# Patient Record
Sex: Male | Born: 1940 | Race: White | Hispanic: No | Marital: Married | Smoking: Never smoker
Health system: Southern US, Community
[De-identification: ages and names within clinical notes are randomized; demographics above are authoritative.]

## PROBLEM LIST (undated history)

## (undated) DIAGNOSIS — F32A Depression, unspecified: Secondary | ICD-10-CM

## (undated) DIAGNOSIS — C4A9 Merkel cell carcinoma, unspecified: Secondary | ICD-10-CM

## (undated) DIAGNOSIS — F0390 Unspecified dementia without behavioral disturbance: Secondary | ICD-10-CM

## (undated) DIAGNOSIS — I10 Essential (primary) hypertension: Secondary | ICD-10-CM

## (undated) DIAGNOSIS — E785 Hyperlipidemia, unspecified: Secondary | ICD-10-CM

## (undated) DIAGNOSIS — C439 Malignant melanoma of skin, unspecified: Secondary | ICD-10-CM

## (undated) DIAGNOSIS — N400 Enlarged prostate without lower urinary tract symptoms: Secondary | ICD-10-CM

## (undated) HISTORY — PX: BACK SURGERY: SHX140

---

## 2011-07-18 DIAGNOSIS — I2510 Atherosclerotic heart disease of native coronary artery without angina pectoris: Secondary | ICD-10-CM | POA: Diagnosis present

## 2011-07-18 DIAGNOSIS — F419 Anxiety disorder, unspecified: Secondary | ICD-10-CM | POA: Insufficient documentation

## 2019-12-03 DIAGNOSIS — I517 Cardiomegaly: Secondary | ICD-10-CM | POA: Insufficient documentation

## 2020-02-29 DIAGNOSIS — K5792 Diverticulitis of intestine, part unspecified, without perforation or abscess without bleeding: Secondary | ICD-10-CM | POA: Insufficient documentation

## 2020-07-31 DIAGNOSIS — R4182 Altered mental status, unspecified: Secondary | ICD-10-CM | POA: Insufficient documentation

## 2020-08-07 DIAGNOSIS — E538 Deficiency of other specified B group vitamins: Secondary | ICD-10-CM | POA: Insufficient documentation

## 2020-08-07 DIAGNOSIS — S22000A Wedge compression fracture of unspecified thoracic vertebra, initial encounter for closed fracture: Secondary | ICD-10-CM | POA: Insufficient documentation

## 2020-08-07 DIAGNOSIS — N184 Chronic kidney disease, stage 4 (severe): Secondary | ICD-10-CM | POA: Insufficient documentation

## 2020-08-07 DIAGNOSIS — S92244A Nondisplaced fracture of medial cuneiform of right foot, initial encounter for closed fracture: Secondary | ICD-10-CM | POA: Insufficient documentation

## 2020-08-22 DIAGNOSIS — K5900 Constipation, unspecified: Secondary | ICD-10-CM | POA: Insufficient documentation

## 2021-02-23 ENCOUNTER — Emergency Department: Payer: Medicare PPO

## 2021-02-23 ENCOUNTER — Encounter: Payer: Self-pay | Admitting: Emergency Medicine

## 2021-02-23 ENCOUNTER — Observation Stay
Admission: EM | Admit: 2021-02-23 | Discharge: 2021-02-25 | Disposition: A | Discharge status: Home / Self Care | Payer: Medicare PPO | Attending: Obstetrics and Gynecology | Admitting: Obstetrics and Gynecology

## 2021-02-23 ENCOUNTER — Other Ambulatory Visit: Payer: Self-pay

## 2021-02-23 ENCOUNTER — Observation Stay: Payer: Medicare PPO

## 2021-02-23 DIAGNOSIS — X58XXXA Exposure to other specified factors, initial encounter: Secondary | ICD-10-CM | POA: Diagnosis not present

## 2021-02-23 DIAGNOSIS — J40 Bronchitis, not specified as acute or chronic: Secondary | ICD-10-CM | POA: Diagnosis not present

## 2021-02-23 DIAGNOSIS — I1310 Hypertensive heart and chronic kidney disease without heart failure, with stage 1 through stage 4 chronic kidney disease, or unspecified chronic kidney disease: Secondary | ICD-10-CM | POA: Diagnosis not present

## 2021-02-23 DIAGNOSIS — C439 Malignant melanoma of skin, unspecified: Secondary | ICD-10-CM | POA: Diagnosis present

## 2021-02-23 DIAGNOSIS — S2220XA Unspecified fracture of sternum, initial encounter for closed fracture: Secondary | ICD-10-CM | POA: Diagnosis present

## 2021-02-23 DIAGNOSIS — R059 Cough, unspecified: Secondary | ICD-10-CM | POA: Diagnosis not present

## 2021-02-23 DIAGNOSIS — R0789 Other chest pain: Secondary | ICD-10-CM | POA: Diagnosis present

## 2021-02-23 DIAGNOSIS — S2222XA Fracture of body of sternum, initial encounter for closed fracture: Secondary | ICD-10-CM | POA: Insufficient documentation

## 2021-02-23 DIAGNOSIS — M6281 Muscle weakness (generalized): Secondary | ICD-10-CM | POA: Diagnosis not present

## 2021-02-23 DIAGNOSIS — R590 Localized enlarged lymph nodes: Secondary | ICD-10-CM | POA: Diagnosis present

## 2021-02-23 DIAGNOSIS — E785 Hyperlipidemia, unspecified: Secondary | ICD-10-CM | POA: Diagnosis not present

## 2021-02-23 DIAGNOSIS — I2510 Atherosclerotic heart disease of native coronary artery without angina pectoris: Secondary | ICD-10-CM | POA: Diagnosis not present

## 2021-02-23 DIAGNOSIS — C4A9 Merkel cell carcinoma, unspecified: Secondary | ICD-10-CM | POA: Diagnosis not present

## 2021-02-23 DIAGNOSIS — R591 Generalized enlarged lymph nodes: Secondary | ICD-10-CM | POA: Diagnosis not present

## 2021-02-23 DIAGNOSIS — C4362 Malignant melanoma of left upper limb, including shoulder: Secondary | ICD-10-CM | POA: Insufficient documentation

## 2021-02-23 DIAGNOSIS — C773 Secondary and unspecified malignant neoplasm of axilla and upper limb lymph nodes: Secondary | ICD-10-CM

## 2021-02-23 DIAGNOSIS — C7A8 Other malignant neuroendocrine tumors: Secondary | ICD-10-CM | POA: Diagnosis not present

## 2021-02-23 DIAGNOSIS — Z79899 Other long term (current) drug therapy: Secondary | ICD-10-CM | POA: Diagnosis not present

## 2021-02-23 DIAGNOSIS — M8448XA Pathological fracture, other site, initial encounter for fracture: Secondary | ICD-10-CM

## 2021-02-23 DIAGNOSIS — R079 Chest pain, unspecified: Secondary | ICD-10-CM | POA: Diagnosis present

## 2021-02-23 DIAGNOSIS — N1832 Chronic kidney disease, stage 3b: Secondary | ICD-10-CM

## 2021-02-23 DIAGNOSIS — F0393 Unspecified dementia, unspecified severity, with mood disturbance: Secondary | ICD-10-CM | POA: Diagnosis not present

## 2021-02-23 DIAGNOSIS — I700 Atherosclerosis of aorta: Secondary | ICD-10-CM | POA: Insufficient documentation

## 2021-02-23 DIAGNOSIS — I10 Essential (primary) hypertension: Secondary | ICD-10-CM | POA: Diagnosis present

## 2021-02-23 DIAGNOSIS — N179 Acute kidney failure, unspecified: Secondary | ICD-10-CM | POA: Diagnosis not present

## 2021-02-23 DIAGNOSIS — N400 Enlarged prostate without lower urinary tract symptoms: Secondary | ICD-10-CM | POA: Diagnosis present

## 2021-02-23 DIAGNOSIS — R2681 Unsteadiness on feet: Secondary | ICD-10-CM | POA: Insufficient documentation

## 2021-02-23 DIAGNOSIS — Z20822 Contact with and (suspected) exposure to covid-19: Secondary | ICD-10-CM | POA: Insufficient documentation

## 2021-02-23 DIAGNOSIS — R7989 Other specified abnormal findings of blood chemistry: Secondary | ICD-10-CM

## 2021-02-23 DIAGNOSIS — F32A Depression, unspecified: Secondary | ICD-10-CM | POA: Diagnosis not present

## 2021-02-23 HISTORY — DX: Depression, unspecified: F32.A

## 2021-02-23 HISTORY — DX: Malignant melanoma of skin, unspecified: C43.9

## 2021-02-23 HISTORY — DX: Essential (primary) hypertension: I10

## 2021-02-23 HISTORY — DX: Hyperlipidemia, unspecified: E78.5

## 2021-02-23 HISTORY — DX: Benign prostatic hyperplasia without lower urinary tract symptoms: N40.0

## 2021-02-23 LAB — URINALYSIS, COMPLETE (UACMP) WITH MICROSCOPIC
Bacteria, UA: NONE SEEN
Bilirubin Urine: NEGATIVE
Glucose, UA: NEGATIVE mg/dL
Ketones, ur: NEGATIVE mg/dL
Leukocytes,Ua: NEGATIVE
Nitrite: NEGATIVE
Protein, ur: >300 mg/dL — AB
Specific Gravity, Urine: 1.02 (ref 1.005–1.030)
pH: 5.5 (ref 5.0–8.0)

## 2021-02-23 LAB — COMPREHENSIVE METABOLIC PANEL
ALT: 9 U/L (ref 0–44)
AST: 18 U/L (ref 15–41)
Albumin: 3.4 g/dL — ABNORMAL LOW (ref 3.5–5.0)
Alkaline Phosphatase: 41 U/L (ref 38–126)
Anion gap: 6 (ref 5–15)
BUN: 21 mg/dL (ref 8–23)
CO2: 26 mmol/L (ref 22–32)
Calcium: 8.6 mg/dL — ABNORMAL LOW (ref 8.9–10.3)
Chloride: 106 mmol/L (ref 98–111)
Creatinine, Ser: 1.95 mg/dL — ABNORMAL HIGH (ref 0.61–1.24)
GFR, Estimated: 34 mL/min — ABNORMAL LOW (ref >60)
Glucose, Bld: 89 mg/dL (ref 70–99)
Potassium: 3.7 mmol/L (ref 3.5–5.1)
Sodium: 138 mmol/L (ref 135–145)
Total Bilirubin: 0.8 mg/dL (ref 0.3–1.2)
Total Protein: 6.1 g/dL — ABNORMAL LOW (ref 6.5–8.1)

## 2021-02-23 LAB — CBC WITH DIFFERENTIAL/PLATELET
Abs Immature Granulocytes: 0.05 10*3/uL (ref 0.00–0.07)
Basophils Absolute: 0.1 10*3/uL (ref 0.0–0.1)
Basophils Relative: 1 %
Eosinophils Absolute: 0.5 10*3/uL (ref 0.0–0.5)
Eosinophils Relative: 9 %
HCT: 38.9 % — ABNORMAL LOW (ref 39.0–52.0)
Hemoglobin: 12.1 g/dL — ABNORMAL LOW (ref 13.0–17.0)
Immature Granulocytes: 1 %
Lymphocytes Relative: 17 %
Lymphs Abs: 1 10*3/uL (ref 0.7–4.0)
MCH: 31.4 pg (ref 26.0–34.0)
MCHC: 31.1 g/dL (ref 30.0–36.0)
MCV: 101 fL — ABNORMAL HIGH (ref 80.0–100.0)
Monocytes Absolute: 0.7 10*3/uL (ref 0.1–1.0)
Monocytes Relative: 12 %
Neutro Abs: 3.4 10*3/uL (ref 1.7–7.7)
Neutrophils Relative %: 60 %
Platelets: 174 10*3/uL (ref 150–400)
RBC: 3.85 MIL/uL — ABNORMAL LOW (ref 4.22–5.81)
RDW: 13.1 % (ref 11.5–15.5)
WBC: 5.6 10*3/uL (ref 4.0–10.5)
nRBC: 0 % (ref 0.0–0.2)

## 2021-02-23 LAB — PROTIME-INR
INR: 1 (ref 0.8–1.2)
Prothrombin Time: 13.5 seconds (ref 11.4–15.2)

## 2021-02-23 LAB — TROPONIN I (HIGH SENSITIVITY)
Troponin I (High Sensitivity): 5 ng/L (ref <18)
Troponin I (High Sensitivity): 5 ng/L (ref <18)
Troponin I (High Sensitivity): 9 ng/L (ref <18)

## 2021-02-23 LAB — APTT: aPTT: 34 seconds (ref 24–36)

## 2021-02-23 LAB — LACTIC ACID, PLASMA: Lactic Acid, Venous: 1.5 mmol/L (ref 0.5–1.9)

## 2021-02-23 LAB — RESP PANEL BY RT-PCR (FLU A&B, COVID) ARPGX2
Influenza A by PCR: NEGATIVE
Influenza B by PCR: NEGATIVE
SARS Coronavirus 2 by RT PCR: NEGATIVE

## 2021-02-23 LAB — D-DIMER, QUANTITATIVE: D-Dimer, Quant: 1.4 ug/mL-FEU — ABNORMAL HIGH (ref 0.00–0.50)

## 2021-02-23 MED ORDER — LATANOPROST 0.005 % OP SOLN
1.0000 [drp] | Freq: Every day | OPHTHALMIC | Status: DC
  Administered 2021-02-23 – 2021-02-24 (×2): 1 [drp] via OPHTHALMIC
  Filled 2021-02-23: qty 2.5

## 2021-02-23 MED ORDER — DORZOLAMIDE HCL-TIMOLOL MAL 2-0.5 % OP SOLN: 1.0000 [drp] | Freq: Two times a day (BID) | OPHTHALMIC | Status: DC

## 2021-02-23 MED ORDER — SIMVASTATIN 20 MG PO TABS
20.0000 mg | ORAL_TABLET | Freq: Every evening | ORAL | Status: DC
  Administered 2021-02-23 – 2021-02-24 (×2): 20 mg via ORAL
  Filled 2021-02-23: qty 1
  Filled 2021-02-23: qty 2

## 2021-02-23 MED ORDER — TIMOLOL MALEATE 0.5 % OP SOLN
1.0000 [drp] | Freq: Two times a day (BID) | OPHTHALMIC | Status: DC
  Administered 2021-02-23 – 2021-02-25 (×4): 1 [drp] via OPHTHALMIC
  Filled 2021-02-23: qty 5

## 2021-02-23 MED ORDER — ALBUTEROL SULFATE (2.5 MG/3ML) 0.083% IN NEBU
2.5000 mg | INHALATION_SOLUTION | RESPIRATORY_TRACT | Status: DC | PRN
  Administered 2021-02-23 – 2021-02-24 (×4): 2.5 mg via RESPIRATORY_TRACT
  Filled 2021-02-23 (×4): qty 3

## 2021-02-23 MED ORDER — ESCITALOPRAM OXALATE 10 MG PO TABS
10.0000 mg | ORAL_TABLET | Freq: Every day | ORAL | Status: DC
  Administered 2021-02-24 – 2021-02-25 (×2): 10 mg via ORAL
  Filled 2021-02-23 (×2): qty 1

## 2021-02-23 MED ORDER — ONDANSETRON HCL 4 MG/2ML IJ SOLN
4.0000 mg | Freq: Once | INTRAMUSCULAR | Status: AC
  Administered 2021-02-23: 4 mg via INTRAVENOUS
  Filled 2021-02-23: qty 2

## 2021-02-23 MED ORDER — ACETAMINOPHEN 325 MG PO TABS
650.0000 mg | ORAL_TABLET | Freq: Four times a day (QID) | ORAL | Status: DC | PRN
  Administered 2021-02-24: 650 mg via ORAL
  Filled 2021-02-23: qty 2

## 2021-02-23 MED ORDER — ASPIRIN EC 81 MG PO TBEC
81.0000 mg | DELAYED_RELEASE_TABLET | Freq: Every day | ORAL | Status: DC
  Administered 2021-02-24 – 2021-02-25 (×2): 81 mg via ORAL
  Filled 2021-02-23 (×2): qty 1

## 2021-02-23 MED ORDER — OXYCODONE-ACETAMINOPHEN 5-325 MG PO TABS
1.0000 | ORAL_TABLET | ORAL | Status: DC | PRN
Start: 2021-02-23 — End: 2021-02-25
  Administered 2021-02-23 – 2021-02-25 (×9): 1 via ORAL
  Filled 2021-02-23 (×9): qty 1

## 2021-02-23 MED ORDER — ASPIRIN 81 MG PO CHEW
324.0000 mg | CHEWABLE_TABLET | Freq: Once | ORAL | Status: AC
  Administered 2021-02-23: 324 mg via ORAL
  Filled 2021-02-23: qty 4

## 2021-02-23 MED ORDER — CALCIUM CARBONATE ANTACID 500 MG PO CHEW
1.0000 | CHEWABLE_TABLET | Freq: Two times a day (BID) | ORAL | Status: DC
  Administered 2021-02-23 – 2021-02-25 (×5): 200 mg via ORAL
  Filled 2021-02-23 (×5): qty 1

## 2021-02-23 MED ORDER — DM-GUAIFENESIN ER 30-600 MG PO TB12
1.0000 | ORAL_TABLET | Freq: Two times a day (BID) | ORAL | Status: DC | PRN
  Administered 2021-02-24 – 2021-02-25 (×3): 1 via ORAL
  Filled 2021-02-23 (×3): qty 1

## 2021-02-23 MED ORDER — ALBUTEROL SULFATE HFA 108 (90 BASE) MCG/ACT IN AERS: 2.0000 | INHALATION_SPRAY | RESPIRATORY_TRACT | Status: DC | PRN

## 2021-02-23 MED ORDER — MORPHINE SULFATE (PF) 4 MG/ML IV SOLN
4.0000 mg | Freq: Once | INTRAVENOUS | Status: AC
  Administered 2021-02-23: 4 mg via INTRAVENOUS
  Filled 2021-02-23: qty 1

## 2021-02-23 MED ORDER — AMLODIPINE BESYLATE 10 MG PO TABS
10.0000 mg | ORAL_TABLET | Freq: Every day | ORAL | Status: DC
  Administered 2021-02-23 – 2021-02-25 (×3): 10 mg via ORAL
  Filled 2021-02-23: qty 2
  Filled 2021-02-23 (×2): qty 1

## 2021-02-23 MED ORDER — TRAZODONE HCL 100 MG PO TABS
100.0000 mg | ORAL_TABLET | Freq: Every day | ORAL | Status: DC
  Administered 2021-02-23 – 2021-02-24 (×2): 100 mg via ORAL
  Filled 2021-02-23 (×2): qty 1

## 2021-02-23 MED ORDER — HEPARIN SODIUM (PORCINE) 5000 UNIT/ML IJ SOLN
5000.0000 [IU] | Freq: Three times a day (TID) | INTRAMUSCULAR | Status: DC
  Administered 2021-02-23 – 2021-02-25 (×6): 5000 [IU] via SUBCUTANEOUS
  Filled 2021-02-23 (×6): qty 1

## 2021-02-23 MED ORDER — TAMSULOSIN HCL 0.4 MG PO CAPS
0.4000 mg | ORAL_CAPSULE | Freq: Every day | ORAL | Status: DC
  Administered 2021-02-23 – 2021-02-24 (×2): 0.4 mg via ORAL
  Filled 2021-02-23 (×2): qty 1

## 2021-02-23 MED ORDER — DORZOLAMIDE HCL 2 % OP SOLN
1.0000 [drp] | Freq: Two times a day (BID) | OPHTHALMIC | Status: DC
  Administered 2021-02-23 – 2021-02-25 (×4): 1 [drp] via OPHTHALMIC
  Filled 2021-02-23: qty 10

## 2021-02-23 MED ORDER — IOHEXOL 350 MG/ML SOLN
60.0000 mL | Freq: Once | INTRAVENOUS | Status: AC | PRN
  Administered 2021-02-23: 60 mL via INTRAVENOUS

## 2021-02-23 MED ORDER — ONDANSETRON HCL 4 MG/2ML IJ SOLN
4.0000 mg | Freq: Three times a day (TID) | INTRAMUSCULAR | Status: DC | PRN
  Administered 2021-02-23: 4 mg via INTRAVENOUS
  Filled 2021-02-23: qty 2

## 2021-02-23 MED ORDER — NITROGLYCERIN 0.4 MG SL SUBL: 0.4000 mg | SUBLINGUAL_TABLET | SUBLINGUAL | Status: DC | PRN

## 2021-02-23 MED ORDER — FLUTICASONE PROPIONATE 50 MCG/ACT NA SUSP
2.0000 | Freq: Every day | NASAL | Status: DC
  Administered 2021-02-25: 2 via NASAL
  Filled 2021-02-23 (×2): qty 16

## 2021-02-23 MED ORDER — SENNOSIDES-DOCUSATE SODIUM 8.6-50 MG PO TABS
2.0000 | ORAL_TABLET | Freq: Two times a day (BID) | ORAL | Status: DC
  Administered 2021-02-23 – 2021-02-25 (×4): 2 via ORAL
  Filled 2021-02-23 (×4): qty 2

## 2021-02-23 MED ORDER — HYDRALAZINE HCL 20 MG/ML IJ SOLN: 5.0000 mg | INTRAMUSCULAR | Status: DC | PRN

## 2021-02-23 MED ORDER — POLYETHYLENE GLYCOL 3350 17 G PO PACK: 17.0000 g | PACK | Freq: Every day | ORAL | Status: DC | PRN

## 2021-02-23 MED ORDER — LORATADINE 10 MG PO TABS: 10.0000 mg | ORAL_TABLET | Freq: Every day | ORAL | Status: DC | PRN

## 2021-02-23 MED ORDER — MELATONIN 5 MG PO TABS
5.0000 mg | ORAL_TABLET | Freq: Every day | ORAL | Status: DC
  Administered 2021-02-23 – 2021-02-24 (×2): 5 mg via ORAL
  Filled 2021-02-23 (×2): qty 1

## 2021-02-23 MED ORDER — GABAPENTIN 100 MG PO CAPS
200.0000 mg | ORAL_CAPSULE | Freq: Every day | ORAL | Status: DC
  Administered 2021-02-23 – 2021-02-24 (×2): 200 mg via ORAL
  Filled 2021-02-23 (×2): qty 2

## 2021-02-23 MED ORDER — MORPHINE SULFATE (PF) 2 MG/ML IV SOLN
0.5000 mg | INTRAVENOUS | Status: DC | PRN
  Administered 2021-02-24: 0.5 mg via INTRAVENOUS
  Filled 2021-02-23: qty 1

## 2021-02-23 MED ORDER — SODIUM CHLORIDE 0.9 % IV SOLN: INTRAVENOUS | Status: AC
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
## 2021-02-23 NOTE — ED Notes (Signed)
Pt resting in bed at this time. VSS. NAD. Pt given drink. Urinal provided and pt was able to use it without any assistance. Call light within reach will continue to monitor.

## 2021-02-23 NOTE — Assessment & Plan Note (Signed)
-  see A&P under melanoma -Pain control: As needed morphine, Percocet, Tylenol -Incentive spirometry

## 2021-02-23 NOTE — Assessment & Plan Note (Signed)
-   Continue home medications 

## 2021-02-23 NOTE — Assessment & Plan Note (Signed)
-   Flomax 

## 2021-02-23 NOTE — Assessment & Plan Note (Signed)
Patient has a history of myeloma in left forearm, which was removed last year.  Now patient has left axillary lymphadenopathy and lytic sternal bone disease, concerning for metastasized disease.  Consulted Dr. Tasia Catchings of oncology -Follow-up oncology's recommendations

## 2021-02-23 NOTE — Assessment & Plan Note (Addendum)
Patient has pleuritic chest pain, CT angiogram is negative for PE.  Patient has tenderness over sternum which is likely due to lyctic sternum fracture, concerning for metastasized to disease.  Troponin negative x3  -Placed on telemetry bed for position -prn NGT -Aspirin -Trend troponin -Check A1c, FLP -As needed Percocet and morphine for pain

## 2021-02-23 NOTE — ED Notes (Signed)
Transport request put in

## 2021-02-23 NOTE — Assessment & Plan Note (Signed)
-  see A&P under melanoma

## 2021-02-23 NOTE — ED Triage Notes (Signed)
Patient to ED via [facility] from [facility] for CP that started approx 1 hour PTA. Patient has hx of high blood pressure.

## 2021-02-23 NOTE — Assessment & Plan Note (Addendum)
Blood pressure 185/170.  Patient is not taking medications currently -Start amlodipine 10 mg daily -IV hydralazine as needed

## 2021-02-23 NOTE — ED Notes (Signed)
Patient to CT at this time

## 2021-02-23 NOTE — ED Notes (Signed)
Family in room waiting to talk to Dr. Cinda Quest at this time. NAD. Will continue to monitor for changes.
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
## 2021-02-23 NOTE — Assessment & Plan Note (Signed)
-   Zocor 

## 2021-02-23 NOTE — Assessment & Plan Note (Signed)
Creatinine 1.95, BUN 21, no baseline creatinine available.  Not sure if patient has CKD, but likely, will treat as AKI -IV fluid: 75 cc/h normal saline for 6 hours -Avoid using renal toxic medications.

## 2021-02-23 NOTE — ED Notes (Signed)
[place]   WITH  [facility]

## 2021-02-23 NOTE — ED Notes (Signed)
Pt resting in room at this time. Does not want his dinner at this time. VSS. NAD. Will continue to monitor for changes.

## 2021-02-23 NOTE — ED Notes (Signed)
Pt eating lunch at this time. Call light within reach. Will continue to monitor.

## 2021-02-24 ENCOUNTER — Observation Stay: Payer: Medicare PPO

## 2021-02-24 DIAGNOSIS — R0789 Other chest pain: Secondary | ICD-10-CM | POA: Diagnosis not present

## 2021-02-24 LAB — LIPID PANEL
Cholesterol: 139 mg/dL (ref 0–200)
HDL: 40 mg/dL — ABNORMAL LOW (ref >40)
LDL Cholesterol: 80 mg/dL (ref 0–99)
Total CHOL/HDL Ratio: 3.5 RATIO
Triglycerides: 97 mg/dL (ref <150)
VLDL: 19 mg/dL (ref 0–40)

## 2021-02-24 LAB — BASIC METABOLIC PANEL
Anion gap: 8 (ref 5–15)
BUN: 23 mg/dL (ref 8–23)
CO2: 26 mmol/L (ref 22–32)
Calcium: 8.6 mg/dL — ABNORMAL LOW (ref 8.9–10.3)
Chloride: 104 mmol/L (ref 98–111)
Creatinine, Ser: 1.89 mg/dL — ABNORMAL HIGH (ref 0.61–1.24)
GFR, Estimated: 35 mL/min — ABNORMAL LOW (ref >60)
Glucose, Bld: 102 mg/dL — ABNORMAL HIGH (ref 70–99)
Potassium: 3.5 mmol/L (ref 3.5–5.1)
Sodium: 138 mmol/L (ref 135–145)

## 2021-02-24 LAB — HEMOGLOBIN A1C
Hgb A1c MFr Bld: 5.4 % (ref 4.8–5.6)
Mean Plasma Glucose: 108 mg/dL

## 2021-02-24 LAB — VITAMIN B12: Vitamin B-12: 187 pg/mL (ref 180–914)

## 2021-02-24 LAB — FOLATE: Folate: 19.9 ng/mL (ref >5.9)

## 2021-02-24 MED ORDER — LIDOCAINE 5 % EX PTCH
1.0000 | MEDICATED_PATCH | CUTANEOUS | Status: DC
  Administered 2021-02-24: 1 via TRANSDERMAL
  Filled 2021-02-24 (×2): qty 1

## 2021-02-24 MED ORDER — METHOCARBAMOL 500 MG PO TABS
500.0000 mg | ORAL_TABLET | Freq: Three times a day (TID) | ORAL | Status: DC
  Administered 2021-02-24 – 2021-02-25 (×3): 500 mg via ORAL
  Filled 2021-02-24 (×3): qty 1
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
## 2021-02-24 NOTE — Progress Notes (Signed)
OT Cancellation Note  Patient Details Name: Jason Lin MRN: [id number] DOB: 1940-09-18   Cancelled Treatment:    Reason Eval/Treat Not Completed: Pain limiting ability to participate. Consult received, chart reviewed. Upon attempt, pt reports "not doing well" and having 10/10 chest pain. RN promptly notified. Will re-attempt OT evaluation next date and will coordinate with nursing for optimal pain control.   Ardeth Perfect., MPH, MS, OTR/L ascom [phone] 02/24/21, 3:45 PM

## 2021-02-24 NOTE — Procedures (Signed)
Interventional Radiology Procedure Note  Procedure: US guided left axillary lymph node biopsy  Indication: Left axillary adenopathy  Findings: Please refer to procedural dictation for full description.  Complications: None  EBL: < 10 mL  Miachel Roux, MD [phone]

## 2021-02-25 ENCOUNTER — Telehealth: Payer: Self-pay

## 2021-02-25 DIAGNOSIS — C4A9 Merkel cell carcinoma, unspecified: Secondary | ICD-10-CM

## 2021-02-25 DIAGNOSIS — N1832 Chronic kidney disease, stage 3b: Secondary | ICD-10-CM

## 2021-02-25 DIAGNOSIS — I2510 Atherosclerotic heart disease of native coronary artery without angina pectoris: Secondary | ICD-10-CM | POA: Diagnosis present

## 2021-02-25 DIAGNOSIS — R0789 Other chest pain: Secondary | ICD-10-CM | POA: Diagnosis not present

## 2021-02-25 LAB — KAPPA/LAMBDA LIGHT CHAINS
Kappa free light chain: 54 mg/L — ABNORMAL HIGH (ref 3.3–19.4)
Kappa, lambda light chain ratio: 2.23 — ABNORMAL HIGH (ref 0.26–1.65)
Lambda free light chains: 24.2 mg/L (ref 5.7–26.3)

## 2021-02-25 MED ORDER — PREDNISONE 20 MG PO TABS
40.0000 mg | ORAL_TABLET | Freq: Every day | ORAL | Status: DC
  Administered 2021-02-25: 40 mg via ORAL
  Filled 2021-02-25: qty 2

## 2021-02-25 MED ORDER — PREDNISONE 20 MG PO TABS: 40.0000 mg | ORAL_TABLET | Freq: Every day | ORAL | 0 refills | Status: DC

## 2021-02-25 NOTE — Telephone Encounter (Signed)
please schedule PET, then MD (hospital follow up) a few days after PET (at least 1 week from today). Please inform facility [facility]) and POA (brother) of appts.

## 2021-02-25 NOTE — TOC Transition Note (Signed)
Transition of Care [facility]) - CM/SW Discharge Note   Patient Details  Name: Jason Lin MRN: [id number] Date of Birth: 11-19-1940  [facility]) CM/SW Contact:  Alberteen Sam, LCSW Phone Number: 02/25/2021, 1:04 PM   Clinical Narrative:     Patient will DC UW:[id number] house Anticipated DC date: 02/25/21 Family notified: brother Jason Lin Transport [id number]  Per MD patient ready for DC to [facility]. RN, patient, patient's family, and facility notified of DC. Discharge Summary sent to facility at [phone]. RN given number for report [phone] ask for med tech or care manager with assisted living. DC packet on chart. Ambulance transport requested for patient.  CSW signing off.  Pricilla Riffle, LCSW    Final next level of care: Assisted Living Barriers to Discharge: No Barriers Identified   Patient Goals and CMS Choice Patient states their goals for this hospitalization and ongoing recovery are:: to go home CMS Medicare.gov Compare Post Acute Care list provided to:: Patient Represenative (must comment) (brother Jason Lin) Choice offered to / list presented to : Adult Children  Discharge Placement              Patient chooses bed at:  [facility]) Patient to be transferred to facility by: [facility] Name of family member notified: brother Jason Lin Patient and family notified of of transfer: 02/25/21  Discharge Plan and Services                                     Social Determinants of Health (SDOH) Interventions     Readmission Risk Interventions No flowsheet data found.

## 2021-02-25 NOTE — NC FL2 (Signed)
[place] LEVEL OF CARE SCREENING TOOL     IDENTIFICATION  Patient Name: Jason Lin Birthdate: Dec 14, 1940 Sex: male Admission Date (Current Location): 02/23/2021  [facility] and [place] Number:  Engineering geologist and Address:   [facility], [address], [place], [place] [address]      Provider Number: [id number]  Attending Physician Name and Address:  Gwynne Edinger, MD  Relative Name and Phone Number:  Laveda Abbe (brother) [phone]    Current Level of Care: Hospital Recommended Level of Care: Assisted Living Facility Prior Approval Number:    Date Approved/Denied:   PASRR Number:    Discharge Plan: Other (Comment) ([facility])    Current Diagnoses: Patient Active Problem List   Diagnosis Date Noted   CAD (coronary artery disease) 02/25/2021   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   Stage 3b chronic kidney disease (CKD) ([place]) 02/25/2021   Chest pain 02/23/2021   Closed fracture sternum 02/23/2021   Axillary lymphadenopathy 02/23/2021   HTN (hypertension) 02/23/2021   AKI (acute kidney injury) ([place]) 02/23/2021   Hypertension 02/23/2021   HLD (hyperlipidemia) 02/23/2021   Depression 02/23/2021   BPH (benign prostatic hyperplasia) 02/23/2021   Positive D-dimer    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[place])     Orientation RESPIRATION BLADDER Height & Weight     Self, Situation, Place  Normal Continent Weight: 157 lb 13.6 oz (71.6 kg) Height:  5\' 5"  (165.1 cm)  BEHAVIORAL SYMPTOMS/MOOD NEUROLOGICAL BOWEL NUTRITION STATUS      Continent Diet (heart healthy)  AMBULATORY STATUS COMMUNICATION OF NEEDS Skin   Limited Assist Verbally Normal                       Personal Care Assistance Level of Assistance  Bathing, Feeding, Dressing, Total care Bathing Assistance: Independent Feeding assistance: Independent Dressing Assistance: Independent Total Care Assistance: Limited assistance   Functional Limitations Info  Sight, Hearing,  Speech Sight Info: Impaired Hearing Info: Impaired Speech Info: Adequate    SPECIAL CARE FACTORS FREQUENCY                       Contractures Contractures Info: Not present    Additional Factors Info  Code Status, Allergies Code Status Info: full Allergies Info: no known allergies           Current Medications (02/25/2021):     Discharge Medications: albuterol 108 (90 Base) MCG/ACT inhaler Commonly known as: VENTOLIN HFA Inhale 2 puffs into the lungs every 6 (six) hours as needed for wheezing or shortness of breath.    calcium carbonate 500 MG chewable tablet Commonly known as: TUMS - dosed in mg elemental calcium Chew 1 tablet by mouth 2 (two) times daily.    dorzolamide-timolol 22.3-6.8 MG/ML ophthalmic solution Commonly known as: COSOPT Place 1 drop into both eyes every 12 (twelve) hours.    escitalopram 10 MG tablet Commonly known as: LEXAPRO Take 10 mg by mouth daily.    fexofenadine 60 MG tablet Commonly known as: ALLEGRA Take 60 mg by mouth daily as needed (itching).    fluticasone 50 MCG/ACT nasal spray Commonly known as: FLONASE Place 2 sprays into both nostrils daily.    gabapentin 100 MG capsule Commonly known as: NEURONTIN Take 200 mg by mouth at bedtime.    latanoprost 0.005 % ophthalmic solution Commonly known as: XALATAN 1 drop at bedtime.    melatonin 3 MG Tabs tablet Take 6 mg by mouth at bedtime.  nitroGLYCERIN 0.4 MG SL tablet Commonly known as: NITROSTAT Place 0.4 mg under the tongue every 5 (five) minutes as needed for chest pain.    oxyCODONE 5 MG immediate release tablet Commonly known as: Oxy IR/ROXICODONE Take 2.5 mg by mouth every 6 (six) hours as needed for severe pain.    polyethylene glycol 17 g packet Commonly known as: MIRALAX / GLYCOLAX Take 17 g by mouth daily.    predniSONE 20 MG tablet Commonly known as: DELTASONE Take 2 tablets (40 mg total) by mouth daily with breakfast.    senna-docusate 8.6-50 MG  tablet Commonly known as: Senokot-S Take 2 tablets by mouth 2 (two) times daily.    simvastatin 20 MG tablet Commonly known as: ZOCOR Take 20 mg by mouth daily.    tamsulosin 0.4 MG Caps capsule Commonly known as: FLOMAX Take 0.4 mg by mouth daily after supper.    traZODone 100 MG tablet Commonly known as: DESYREL Take 100 mg by mouth at bedtime.           Relevant Imaging Results:  Relevant Lab Results:   Additional Information [id number]  Alberteen Sam, LCSW
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
## 2021-02-25 NOTE — Telephone Encounter (Signed)
Spoke to [place] at [facility], she stated they provided transportation and they are aware of PET scan and follow up appts with Dr. Tasia Catchings.

## 2021-02-25 NOTE — Plan of Care (Signed)
°  Problem: Education: Goal: Knowledge of General Education information will improve Description: Including pain rating scale, medication(s)/side effects and non-pharmacologic comfort measures 02/25/2021 1328 by Emmaline Life, RN Outcome: Adequate for Discharge 02/25/2021 1328 by Emmaline Life, RN Outcome: Adequate for Discharge   Problem: Health Behavior/Discharge Planning: Goal: Ability to manage health-related needs will improve 02/25/2021 1328 by Emmaline Life, RN Outcome: Adequate for Discharge 02/25/2021 1328 by Emmaline Life, RN Outcome: Adequate for Discharge   Problem: Clinical Measurements: Goal: Ability to maintain clinical measurements within normal limits will improve 02/25/2021 1328 by Emmaline Life, RN Outcome: Adequate for Discharge 02/25/2021 1328 by Emmaline Life, RN Outcome: Adequate for Discharge Goal: Will remain free from infection 02/25/2021 1328 by Emmaline Life, RN Outcome: Adequate for Discharge 02/25/2021 1328 by Emmaline Life, RN Outcome: Adequate for Discharge Goal: Diagnostic test results will improve 02/25/2021 1328 by Emmaline Life, RN Outcome: Adequate for Discharge 02/25/2021 1328 by Emmaline Life, RN Outcome: Adequate for Discharge Goal: Respiratory complications will improve 02/25/2021 1328 by Emmaline Life, RN Outcome: Adequate for Discharge 02/25/2021 1328 by Emmaline Life, RN Outcome: Adequate for Discharge Goal: Cardiovascular complication will be avoided 02/25/2021 1328 by Emmaline Life, RN Outcome: Adequate for Discharge 02/25/2021 1328 by Emmaline Life, RN Outcome: Adequate for Discharge   Problem: Pain Managment: Goal: General experience of comfort will improve 02/25/2021 1328 by Emmaline Life, RN Outcome: Adequate for Discharge 02/25/2021 1328 by Emmaline Life, RN Outcome: Adequate for Discharge   Problem: Safety: Goal: Ability to remain free from injury will improve 02/25/2021 1328 by Emmaline Life, RN Outcome: Adequate for Discharge 02/25/2021 1328 by Emmaline Life, RN Outcome: Adequate for Discharge   Problem: Elimination: Goal: Will not experience complications related to bowel motility 02/25/2021 1328 by Emmaline Life, RN Outcome: Adequate for Discharge 02/25/2021 1328 by Emmaline Life, RN Outcome: Adequate for Discharge Goal: Will not experience complications related to urinary retention 02/25/2021 1328 by Emmaline Life, RN Outcome: Adequate for Discharge 02/25/2021 1328 by Emmaline Life, RN Outcome: Adequate for Discharge   Problem: Coping: Goal: Level of anxiety will decrease 02/25/2021 1328 by Emmaline Life, RN Outcome: Adequate for Discharge 02/25/2021 1328 by Emmaline Life, RN Outcome: Adequate for Discharge   Problem: Nutrition: Goal: Adequate nutrition will be maintained 02/25/2021 1328 by Emmaline Life, RN Outcome: Adequate for Discharge 02/25/2021 1328 by Emmaline Life, RN Outcome: Adequate for Discharge   Problem: Safety: Goal: Ability to remain free from injury will improve 02/25/2021 1328 by Emmaline Life, RN Outcome: Adequate for Discharge 02/25/2021 1328 by Emmaline Life, RN Outcome: Adequate for Discharge   Problem: Skin Integrity: Goal: Risk for impaired skin integrity will decrease 02/25/2021 1328 by Emmaline Life, RN Outcome: Adequate for Discharge 02/25/2021 1328 by Emmaline Life, RN Outcome: Adequate for Discharge   Problem: Acute Rehab OT Goals (only OT should resolve) Goal: Pt. Will Perform Grooming Outcome: Adequate for Discharge Goal: Pt. Will Perform Lower Body Dressing Outcome: Adequate for Discharge Goal: Pt. Will Transfer To Toilet Outcome: Adequate for Discharge Goal: Pt. Will Perform Toileting-Clothing Manipulation Outcome: Adequate for Discharge   Problem: Acute Rehab PT Goals(only PT should resolve) Goal: Pt Will Perform Standing Balance Or Pre-Gait Outcome: Adequate for  Discharge Goal: Pt Will Ambulate Outcome: Adequate for Discharge

## 2021-02-25 NOTE — Telephone Encounter (Signed)
Spoke to brother, Laveda Abbe, he verbalized understanding of appts. I left a VM with [place] regarding appts, will attempted to contact them again about transportation to visits and confirmation they are aware of the appts.

## 2021-02-25 NOTE — Telephone Encounter (Signed)
-----   Message from Earlie Server, MD sent at 02/24/2021  4:03 PM EST ----- He was seen by me during this admission. He will be dc soon. He lives in an assist living facility . He has dementia, brother is POA.  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  Follow up with me 2 days after PET, also at least 1 week from today - he got biopsy during admission. POA would like to come to meet him here when assist living facility transport him to his appt w me. Thanks.
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
## 2021-02-25 NOTE — Progress Notes (Signed)
Report called to [employer] spoke to [place]. Patient left floor via stretcher accompanied by EMS. Discharge paperwork given to ems. Family aware of plan of care.  Aryiah Monterosso, Tivis Ringer, RN
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
## 2021-02-26 ENCOUNTER — Other Ambulatory Visit: Payer: Self-pay | Admitting: Anatomic Pathology & Clinical Pathology

## 2021-02-26 LAB — MULTIPLE MYELOMA PANEL, SERUM
Albumin SerPl Elph-Mcnc: 3 g/dL (ref 2.9–4.4)
Albumin/Glob SerPl: 1.4 (ref 0.7–1.7)
Alpha 1: 0.2 g/dL (ref 0.0–0.4)
Alpha2 Glob SerPl Elph-Mcnc: 0.8 g/dL (ref 0.4–1.0)
B-Globulin SerPl Elph-Mcnc: 0.6 g/dL — ABNORMAL LOW (ref 0.7–1.3)
Gamma Glob SerPl Elph-Mcnc: 0.7 g/dL (ref 0.4–1.8)
Globulin, Total: 2.2 g/dL (ref 2.2–3.9)
IgA: 112 mg/dL (ref 61–437)
IgG (Immunoglobin G), Serum: 717 mg/dL (ref 603–1613)
IgM (Immunoglobulin M), Srm: 76 mg/dL (ref 15–143)
Total Protein ELP: 5.2 g/dL — ABNORMAL LOW (ref 6.0–8.5)

## 2021-02-26 LAB — SURGICAL PATHOLOGY

## 2021-02-27 ENCOUNTER — Encounter: Payer: Self-pay | Admitting: Oncology

## 2021-03-02 ENCOUNTER — Other Ambulatory Visit: Payer: Self-pay

## 2021-03-02 DIAGNOSIS — C4A9 Merkel cell carcinoma, unspecified: Secondary | ICD-10-CM

## 2021-03-03 ENCOUNTER — Ambulatory Visit
Admission: RE | Admit: 2021-03-03 | Discharge: 2021-03-03 | Disposition: A | Discharge status: Home / Self Care | Payer: Medicare PPO | Source: Ambulatory Visit | Attending: Oncology | Admitting: Oncology

## 2021-03-03 ENCOUNTER — Other Ambulatory Visit: Payer: Medicare PPO

## 2021-03-03 ENCOUNTER — Ambulatory Visit: Payer: Medicare PPO

## 2021-03-03 ENCOUNTER — Other Ambulatory Visit: Payer: Self-pay

## 2021-03-03 DIAGNOSIS — C4A9 Merkel cell carcinoma, unspecified: Secondary | ICD-10-CM | POA: Diagnosis present

## 2021-03-03 LAB — GLUCOSE, CAPILLARY: Glucose-Capillary: 91 mg/dL (ref 70–99)

## 2021-03-03 MED ORDER — FLUDEOXYGLUCOSE F - 18 (FDG) INJECTION
8.1000 | Freq: Once | INTRAVENOUS | Status: AC
  Administered 2021-03-03: 7.75 via INTRAVENOUS

## 2021-03-05 ENCOUNTER — Telehealth: Payer: Self-pay | Admitting: Oncology

## 2021-03-05 ENCOUNTER — Other Ambulatory Visit: Payer: Medicare PPO

## 2021-03-05 NOTE — Progress Notes (Signed)
Tumor Board Documentation  Paarth Cropper was presented by Dr Tasia Catchings at our Tumor Board on 03/05/2021, which included representatives from surgical, pharmacy, pulmonology, genetics, radiology, pathology, nutrition, research, navigation, radiation oncology, internal medicine, palliative care, medical oncology.  [facility] currently presents as a new patient, for [place], for new positive pathology with history of the following treatments: surgical intervention(s).  Additionally, we reviewed previous medical and familial history, history of present illness, and recent lab results along with all available histopathologic and imaging studies. The tumor board considered available treatment options and made the following recommendations: Radiation therapy (primary modality)    The following procedures/referrals were also placed: No orders of the defined types were placed in this encounter.   Clinical Trial Status: not discussed   Staging used: AJCC Stage Group AJCC Staging:       Group: Metastatic Merkel Cell Carcinoma   National site-specific guidelines NCCN were discussed with respect to the case.  Tumor board is a meeting of clinicians from various specialty areas who evaluate and discuss patients for whom a multidisciplinary approach is being considered. Final determinations in the plan of care are those of the provider(s). The responsibility for follow up of recommendations given during tumor board is that of the provider.   Todays extended care, comprehensive team conference, [place] was not present for the discussion and was not examined.   Multidisciplinary Tumor Board is a multidisciplinary case peer review process.  Decisions discussed in the Multidisciplinary Tumor Board reflect the opinions of the specialists present at the conference without having examined the patient.  Ultimately, treatment and diagnostic decisions rest with the primary provider(s) and the patient.

## 2021-03-05 NOTE — Telephone Encounter (Signed)
Spoke to pt brother, Laveda Abbe, and [employer]. Both are aware of appts scheduled for Dr. Donella Stade and dr. Tasia Catchings on 2/13.

## 2021-03-06 ENCOUNTER — Ambulatory Visit: Payer: Medicare PPO | Admitting: Oncology

## 2021-03-09 ENCOUNTER — Encounter: Payer: Self-pay | Admitting: Radiation Oncology

## 2021-03-09 ENCOUNTER — Inpatient Hospital Stay: Payer: Medicare PPO | Attending: Oncology | Admitting: Oncology

## 2021-03-09 ENCOUNTER — Encounter: Payer: Self-pay | Admitting: Oncology

## 2021-03-09 ENCOUNTER — Other Ambulatory Visit: Payer: Self-pay

## 2021-03-09 ENCOUNTER — Ambulatory Visit
Admission: RE | Admit: 2021-03-09 | Discharge: 2021-03-09 | Disposition: A | Discharge status: Home / Self Care | Payer: Medicare PPO | Source: Ambulatory Visit | Attending: Radiation Oncology | Admitting: Radiation Oncology

## 2021-03-09 VITALS — BP 140/78 | HR 64 | Temp 98.3°F | Resp 16 | Ht 65.0 in | Wt 156.0 lb

## 2021-03-09 VITALS — BP 140/78 | HR 64 | Temp 98.4°F | Wt 156.0 lb

## 2021-03-09 DIAGNOSIS — F0393 Unspecified dementia, unspecified severity, with mood disturbance: Secondary | ICD-10-CM | POA: Insufficient documentation

## 2021-03-09 DIAGNOSIS — C7A8 Other malignant neuroendocrine tumors: Secondary | ICD-10-CM | POA: Insufficient documentation

## 2021-03-09 DIAGNOSIS — Z8249 Family history of ischemic heart disease and other diseases of the circulatory system: Secondary | ICD-10-CM | POA: Insufficient documentation

## 2021-03-09 DIAGNOSIS — Z85820 Personal history of malignant melanoma of skin: Secondary | ICD-10-CM | POA: Diagnosis not present

## 2021-03-09 DIAGNOSIS — R931 Abnormal findings on diagnostic imaging of heart and coronary circulation: Secondary | ICD-10-CM | POA: Insufficient documentation

## 2021-03-09 DIAGNOSIS — C4A9 Merkel cell carcinoma, unspecified: Secondary | ICD-10-CM | POA: Diagnosis not present

## 2021-03-09 DIAGNOSIS — Z7189 Other specified counseling: Secondary | ICD-10-CM | POA: Diagnosis not present

## 2021-03-09 DIAGNOSIS — K5730 Diverticulosis of large intestine without perforation or abscess without bleeding: Secondary | ICD-10-CM | POA: Diagnosis not present

## 2021-03-09 DIAGNOSIS — I700 Atherosclerosis of aorta: Secondary | ICD-10-CM | POA: Insufficient documentation

## 2021-03-09 DIAGNOSIS — Z818 Family history of other mental and behavioral disorders: Secondary | ICD-10-CM | POA: Diagnosis not present

## 2021-03-09 DIAGNOSIS — Z79899 Other long term (current) drug therapy: Secondary | ICD-10-CM | POA: Insufficient documentation

## 2021-03-09 DIAGNOSIS — I2510 Atherosclerotic heart disease of native coronary artery without angina pectoris: Secondary | ICD-10-CM | POA: Insufficient documentation

## 2021-03-09 DIAGNOSIS — N400 Enlarged prostate without lower urinary tract symptoms: Secondary | ICD-10-CM | POA: Insufficient documentation

## 2021-03-09 DIAGNOSIS — I10 Essential (primary) hypertension: Secondary | ICD-10-CM | POA: Diagnosis not present

## 2021-03-09 DIAGNOSIS — E785 Hyperlipidemia, unspecified: Secondary | ICD-10-CM | POA: Insufficient documentation

## 2021-03-09 DIAGNOSIS — R590 Localized enlarged lymph nodes: Secondary | ICD-10-CM | POA: Insufficient documentation

## 2021-03-09 DIAGNOSIS — M47814 Spondylosis without myelopathy or radiculopathy, thoracic region: Secondary | ICD-10-CM | POA: Insufficient documentation

## 2021-03-09 DIAGNOSIS — Z9289 Personal history of other medical treatment: Secondary | ICD-10-CM | POA: Insufficient documentation

## 2021-03-09 DIAGNOSIS — K219 Gastro-esophageal reflux disease without esophagitis: Secondary | ICD-10-CM | POA: Insufficient documentation
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
## 2021-03-16 ENCOUNTER — Encounter: Payer: Self-pay | Admitting: *Deleted

## 2021-03-16 ENCOUNTER — Ambulatory Visit
Admission: RE | Admit: 2021-03-16 | Discharge: 2021-03-16 | Disposition: A | Discharge status: Home / Self Care | Payer: Medicare PPO | Source: Ambulatory Visit | Attending: Radiation Oncology | Admitting: Radiation Oncology

## 2021-03-16 DIAGNOSIS — Z510 Encounter for antineoplastic radiation therapy: Secondary | ICD-10-CM | POA: Diagnosis present

## 2021-03-16 DIAGNOSIS — C4A9 Merkel cell carcinoma, unspecified: Secondary | ICD-10-CM | POA: Insufficient documentation

## 2021-03-18 DIAGNOSIS — Z510 Encounter for antineoplastic radiation therapy: Secondary | ICD-10-CM | POA: Diagnosis not present

## 2021-03-19 ENCOUNTER — Other Ambulatory Visit: Payer: Self-pay

## 2021-03-19 ENCOUNTER — Ambulatory Visit (INDEPENDENT_AMBULATORY_CARE_PROVIDER_SITE_OTHER): Payer: Medicare PPO | Admitting: Podiatry

## 2021-03-19 ENCOUNTER — Other Ambulatory Visit: Payer: Self-pay | Admitting: *Deleted

## 2021-03-19 VITALS — BP 132/65 | HR 40 | Temp 98.6°F | Resp 20

## 2021-03-19 DIAGNOSIS — M79674 Pain in right toe(s): Secondary | ICD-10-CM

## 2021-03-19 DIAGNOSIS — M79675 Pain in left toe(s): Secondary | ICD-10-CM

## 2021-03-19 DIAGNOSIS — B351 Tinea unguium: Secondary | ICD-10-CM

## 2021-03-19 DIAGNOSIS — C4A9 Merkel cell carcinoma, unspecified: Secondary | ICD-10-CM

## 2021-03-19 NOTE — Progress Notes (Signed)
°  Subjective:  Patient ID: Jason Lin, male    DOB: 11-20-1940,  MRN: [id number]  Chief Complaint  Patient presents with   Nail Problem    "Trim his toenails and check his feet."   81 y.o. male returns for the above complaint.  Patient presents with thickened elongated dystrophic toenails x10.  Pain on palpation.  Patient states that hurts to ambulate.  He would like to have them debrided down he is not able to do it himself.  He denies any other acute complaints  Objective:   Vitals:   03/19/21 1021  BP: 132/65  Pulse: (!) 40  Resp: 20  Temp: 98.6 F (37 C)   Podiatric Exam: Vascular: dorsalis pedis and posterior tibial pulses are palpable bilateral. Capillary return is immediate. Temperature gradient is WNL. Skin turgor WNL  Sensorium: Normal Semmes Weinstein monofilament test. Normal tactile sensation bilaterally. Nail Exam: Pt has thick disfigured discolored nails with subungual debris noted bilateral entire nail hallux through fifth toenails.  Pain on palpation to the nails. Ulcer Exam: There is no evidence of ulcer or pre-ulcerative changes or infection. Orthopedic Exam: Muscle tone and strength are WNL. No limitations in general ROM. No crepitus or effusions noted.  Skin: No Porokeratosis. No infection or ulcers    Assessment & Plan:   1. Pain due to onychomycosis of toenails of both feet     Patient was evaluated and treated and all questions answered.  Onychomycosis with pain  -Nails palliatively debrided as below. -Educated on self-care  Procedure: Nail Debridement Rationale: pain  Type of Debridement: manual, sharp debridement. Instrumentation: Nail nipper, rotary burr. Number of Nails: 10  Procedures and Treatment: Consent by patient was obtained for treatment procedures. The patient understood the discussion of treatment and procedures well. All questions were answered thoroughly reviewed. Debridement of mycotic and hypertrophic toenails, 1 through 5  bilateral and clearing of subungual debris. No ulceration, no infection noted.  Return Visit-Office Procedure: Patient instructed to return to the office for a follow up visit 3 months for continued evaluation and treatment.  Boneta Lucks, DPM    No follow-ups on file.

## 2021-03-23 ENCOUNTER — Ambulatory Visit: Admission: RE | Admit: 2021-03-23 | Payer: Medicare PPO | Source: Ambulatory Visit

## 2021-03-23 DIAGNOSIS — Z510 Encounter for antineoplastic radiation therapy: Secondary | ICD-10-CM | POA: Diagnosis not present

## 2021-03-24 ENCOUNTER — Ambulatory Visit
Admission: RE | Admit: 2021-03-24 | Discharge: 2021-03-24 | Disposition: A | Discharge status: Home / Self Care | Payer: Medicare PPO | Source: Ambulatory Visit | Attending: Radiation Oncology | Admitting: Radiation Oncology

## 2021-03-24 DIAGNOSIS — Z510 Encounter for antineoplastic radiation therapy: Secondary | ICD-10-CM | POA: Diagnosis not present

## 2021-03-25 ENCOUNTER — Ambulatory Visit
Admission: RE | Admit: 2021-03-25 | Discharge: 2021-03-25 | Disposition: A | Discharge status: Home / Self Care | Payer: Medicare PPO | Source: Ambulatory Visit | Attending: Radiation Oncology | Admitting: Radiation Oncology

## 2021-03-25 DIAGNOSIS — C4A9 Merkel cell carcinoma, unspecified: Secondary | ICD-10-CM | POA: Diagnosis present

## 2021-03-25 DIAGNOSIS — Z510 Encounter for antineoplastic radiation therapy: Secondary | ICD-10-CM | POA: Insufficient documentation

## 2021-03-26 ENCOUNTER — Ambulatory Visit
Admission: RE | Admit: 2021-03-26 | Discharge: 2021-03-26 | Disposition: A | Discharge status: Home / Self Care | Payer: Medicare PPO | Source: Ambulatory Visit | Attending: Radiation Oncology | Admitting: Radiation Oncology

## 2021-03-26 DIAGNOSIS — Z510 Encounter for antineoplastic radiation therapy: Secondary | ICD-10-CM | POA: Diagnosis not present

## 2021-03-27 ENCOUNTER — Ambulatory Visit
Admission: RE | Admit: 2021-03-27 | Discharge: 2021-03-27 | Disposition: A | Discharge status: Home / Self Care | Payer: Medicare PPO | Source: Ambulatory Visit | Attending: Radiation Oncology | Admitting: Radiation Oncology

## 2021-03-27 DIAGNOSIS — Z510 Encounter for antineoplastic radiation therapy: Secondary | ICD-10-CM | POA: Diagnosis not present

## 2021-03-30 ENCOUNTER — Ambulatory Visit
Admission: RE | Admit: 2021-03-30 | Discharge: 2021-03-30 | Disposition: A | Discharge status: Home / Self Care | Payer: Medicare PPO | Source: Ambulatory Visit | Attending: Radiation Oncology | Admitting: Radiation Oncology

## 2021-03-30 DIAGNOSIS — Z510 Encounter for antineoplastic radiation therapy: Secondary | ICD-10-CM | POA: Diagnosis not present

## 2021-03-31 ENCOUNTER — Ambulatory Visit
Admission: RE | Admit: 2021-03-31 | Discharge: 2021-03-31 | Disposition: A | Discharge status: Home / Self Care | Payer: Medicare PPO | Source: Ambulatory Visit | Attending: Radiation Oncology | Admitting: Radiation Oncology

## 2021-03-31 DIAGNOSIS — Z510 Encounter for antineoplastic radiation therapy: Secondary | ICD-10-CM | POA: Diagnosis not present

## 2021-04-01 ENCOUNTER — Ambulatory Visit
Admission: RE | Admit: 2021-04-01 | Discharge: 2021-04-01 | Disposition: A | Discharge status: Home / Self Care | Payer: Medicare PPO | Source: Ambulatory Visit | Attending: Radiation Oncology | Admitting: Radiation Oncology

## 2021-04-01 DIAGNOSIS — Z510 Encounter for antineoplastic radiation therapy: Secondary | ICD-10-CM | POA: Diagnosis not present

## 2021-04-02 ENCOUNTER — Ambulatory Visit
Admission: RE | Admit: 2021-04-02 | Discharge: 2021-04-02 | Disposition: A | Discharge status: Home / Self Care | Payer: Medicare PPO | Source: Ambulatory Visit | Attending: Radiation Oncology | Admitting: Radiation Oncology

## 2021-04-02 DIAGNOSIS — Z510 Encounter for antineoplastic radiation therapy: Secondary | ICD-10-CM | POA: Diagnosis not present

## 2021-04-03 ENCOUNTER — Ambulatory Visit
Admission: RE | Admit: 2021-04-03 | Discharge: 2021-04-03 | Disposition: A | Discharge status: Home / Self Care | Payer: Medicare PPO | Source: Ambulatory Visit | Attending: Radiation Oncology | Admitting: Radiation Oncology

## 2021-04-03 DIAGNOSIS — Z510 Encounter for antineoplastic radiation therapy: Secondary | ICD-10-CM | POA: Diagnosis not present

## 2021-04-06 ENCOUNTER — Ambulatory Visit
Admission: RE | Admit: 2021-04-06 | Discharge: 2021-04-06 | Disposition: A | Discharge status: Home / Self Care | Payer: Medicare PPO | Source: Ambulatory Visit | Attending: Radiation Oncology | Admitting: Radiation Oncology

## 2021-04-06 DIAGNOSIS — Z510 Encounter for antineoplastic radiation therapy: Secondary | ICD-10-CM | POA: Diagnosis not present

## 2021-04-07 ENCOUNTER — Inpatient Hospital Stay: Payer: Medicare PPO | Attending: Oncology

## 2021-04-07 ENCOUNTER — Ambulatory Visit
Admission: RE | Admit: 2021-04-07 | Discharge: 2021-04-07 | Disposition: A | Discharge status: Home / Self Care | Payer: Medicare PPO | Source: Ambulatory Visit | Attending: Radiation Oncology | Admitting: Radiation Oncology

## 2021-04-07 DIAGNOSIS — Z510 Encounter for antineoplastic radiation therapy: Secondary | ICD-10-CM | POA: Diagnosis not present

## 2021-04-08 ENCOUNTER — Ambulatory Visit
Admission: RE | Admit: 2021-04-08 | Discharge: 2021-04-08 | Disposition: A | Discharge status: Home / Self Care | Payer: Medicare PPO | Source: Ambulatory Visit | Attending: Radiation Oncology | Admitting: Radiation Oncology

## 2021-04-08 DIAGNOSIS — Z510 Encounter for antineoplastic radiation therapy: Secondary | ICD-10-CM | POA: Diagnosis not present

## 2021-04-09 ENCOUNTER — Ambulatory Visit
Admission: RE | Admit: 2021-04-09 | Discharge: 2021-04-09 | Disposition: A | Discharge status: Home / Self Care | Payer: Medicare PPO | Source: Ambulatory Visit | Attending: Radiation Oncology | Admitting: Radiation Oncology

## 2021-04-09 DIAGNOSIS — Z510 Encounter for antineoplastic radiation therapy: Secondary | ICD-10-CM | POA: Diagnosis not present

## 2021-04-10 ENCOUNTER — Ambulatory Visit
Admission: RE | Admit: 2021-04-10 | Discharge: 2021-04-10 | Disposition: A | Discharge status: Home / Self Care | Payer: Medicare PPO | Source: Ambulatory Visit | Attending: Radiation Oncology | Admitting: Radiation Oncology

## 2021-04-10 DIAGNOSIS — Z510 Encounter for antineoplastic radiation therapy: Secondary | ICD-10-CM | POA: Diagnosis not present

## 2021-04-13 ENCOUNTER — Ambulatory Visit
Admission: RE | Admit: 2021-04-13 | Discharge: 2021-04-13 | Disposition: A | Discharge status: Home / Self Care | Payer: Medicare PPO | Source: Ambulatory Visit | Attending: Radiation Oncology | Admitting: Radiation Oncology

## 2021-04-13 DIAGNOSIS — Z510 Encounter for antineoplastic radiation therapy: Secondary | ICD-10-CM | POA: Diagnosis not present

## 2021-04-14 ENCOUNTER — Ambulatory Visit
Admission: RE | Admit: 2021-04-14 | Discharge: 2021-04-14 | Disposition: A | Discharge status: Home / Self Care | Payer: Medicare PPO | Source: Ambulatory Visit | Attending: Radiation Oncology | Admitting: Radiation Oncology

## 2021-04-14 DIAGNOSIS — Z510 Encounter for antineoplastic radiation therapy: Secondary | ICD-10-CM | POA: Diagnosis not present

## 2021-04-15 ENCOUNTER — Ambulatory Visit
Admission: RE | Admit: 2021-04-15 | Discharge: 2021-04-15 | Disposition: A | Discharge status: Home / Self Care | Payer: Medicare PPO | Source: Ambulatory Visit | Attending: Radiation Oncology | Admitting: Radiation Oncology

## 2021-04-15 DIAGNOSIS — Z510 Encounter for antineoplastic radiation therapy: Secondary | ICD-10-CM | POA: Diagnosis not present

## 2021-04-16 ENCOUNTER — Ambulatory Visit
Admission: RE | Admit: 2021-04-16 | Discharge: 2021-04-16 | Disposition: A | Discharge status: Home / Self Care | Payer: Medicare PPO | Source: Ambulatory Visit | Attending: Radiation Oncology | Admitting: Radiation Oncology

## 2021-04-16 DIAGNOSIS — Z510 Encounter for antineoplastic radiation therapy: Secondary | ICD-10-CM | POA: Diagnosis not present

## 2021-04-17 ENCOUNTER — Ambulatory Visit
Admission: RE | Admit: 2021-04-17 | Discharge: 2021-04-17 | Disposition: A | Discharge status: Home / Self Care | Payer: Medicare PPO | Source: Ambulatory Visit | Attending: Radiation Oncology | Admitting: Radiation Oncology

## 2021-04-17 DIAGNOSIS — Z510 Encounter for antineoplastic radiation therapy: Secondary | ICD-10-CM | POA: Diagnosis not present

## 2021-04-20 ENCOUNTER — Ambulatory Visit
Admission: RE | Admit: 2021-04-20 | Discharge: 2021-04-20 | Disposition: A | Discharge status: Home / Self Care | Payer: Medicare PPO | Source: Ambulatory Visit | Attending: Radiation Oncology | Admitting: Radiation Oncology

## 2021-04-20 DIAGNOSIS — Z510 Encounter for antineoplastic radiation therapy: Secondary | ICD-10-CM | POA: Diagnosis not present

## 2021-04-21 ENCOUNTER — Ambulatory Visit
Admission: RE | Admit: 2021-04-21 | Discharge: 2021-04-21 | Disposition: A | Discharge status: Home / Self Care | Payer: Medicare PPO | Source: Ambulatory Visit | Attending: Radiation Oncology | Admitting: Radiation Oncology

## 2021-04-21 ENCOUNTER — Inpatient Hospital Stay: Payer: Medicare PPO

## 2021-04-21 DIAGNOSIS — Z510 Encounter for antineoplastic radiation therapy: Secondary | ICD-10-CM | POA: Diagnosis not present

## 2021-04-22 ENCOUNTER — Ambulatory Visit
Admission: RE | Admit: 2021-04-22 | Discharge: 2021-04-22 | Disposition: A | Discharge status: Home / Self Care | Payer: Medicare PPO | Source: Ambulatory Visit | Attending: Radiation Oncology | Admitting: Radiation Oncology

## 2021-04-22 DIAGNOSIS — Z510 Encounter for antineoplastic radiation therapy: Secondary | ICD-10-CM | POA: Diagnosis not present

## 2021-04-23 ENCOUNTER — Ambulatory Visit
Admission: RE | Admit: 2021-04-23 | Discharge: 2021-04-23 | Disposition: A | Discharge status: Home / Self Care | Payer: Medicare PPO | Source: Ambulatory Visit | Attending: Radiation Oncology | Admitting: Radiation Oncology

## 2021-04-23 DIAGNOSIS — Z510 Encounter for antineoplastic radiation therapy: Secondary | ICD-10-CM | POA: Diagnosis not present

## 2021-04-24 ENCOUNTER — Ambulatory Visit
Admission: RE | Admit: 2021-04-24 | Discharge: 2021-04-24 | Disposition: A | Discharge status: Home / Self Care | Payer: Medicare PPO | Source: Ambulatory Visit | Attending: Radiation Oncology | Admitting: Radiation Oncology

## 2021-04-24 DIAGNOSIS — Z510 Encounter for antineoplastic radiation therapy: Secondary | ICD-10-CM | POA: Diagnosis not present

## 2021-04-27 ENCOUNTER — Ambulatory Visit
Admission: RE | Admit: 2021-04-27 | Discharge: 2021-04-27 | Disposition: A | Discharge status: Home / Self Care | Payer: Medicare PPO | Source: Ambulatory Visit | Attending: Radiation Oncology | Admitting: Radiation Oncology

## 2021-04-27 DIAGNOSIS — Z510 Encounter for antineoplastic radiation therapy: Secondary | ICD-10-CM | POA: Diagnosis present

## 2021-04-27 DIAGNOSIS — C4A9 Merkel cell carcinoma, unspecified: Secondary | ICD-10-CM | POA: Diagnosis present

## 2021-04-28 ENCOUNTER — Ambulatory Visit
Admission: RE | Admit: 2021-04-28 | Discharge: 2021-04-28 | Disposition: A | Discharge status: Home / Self Care | Payer: Medicare PPO | Source: Ambulatory Visit | Attending: Radiation Oncology | Admitting: Radiation Oncology

## 2021-04-28 DIAGNOSIS — Z510 Encounter for antineoplastic radiation therapy: Secondary | ICD-10-CM | POA: Diagnosis not present

## 2021-04-29 ENCOUNTER — Ambulatory Visit
Admission: RE | Admit: 2021-04-29 | Discharge: 2021-04-29 | Disposition: A | Discharge status: Home / Self Care | Payer: Medicare PPO | Source: Ambulatory Visit | Attending: Radiation Oncology | Admitting: Radiation Oncology

## 2021-04-29 DIAGNOSIS — Z510 Encounter for antineoplastic radiation therapy: Secondary | ICD-10-CM | POA: Diagnosis not present

## 2021-04-30 ENCOUNTER — Ambulatory Visit
Admission: RE | Admit: 2021-04-30 | Discharge: 2021-04-30 | Disposition: A | Discharge status: Home / Self Care | Payer: Medicare PPO | Source: Ambulatory Visit | Attending: Radiation Oncology | Admitting: Radiation Oncology

## 2021-04-30 DIAGNOSIS — Z510 Encounter for antineoplastic radiation therapy: Secondary | ICD-10-CM | POA: Diagnosis not present

## 2021-05-05 ENCOUNTER — Telehealth: Payer: Self-pay | Admitting: *Deleted

## 2021-05-05 NOTE — Telephone Encounter (Signed)
Message from answering service stating that [facility] called want clarification of what is going on with patient. Please return his call ?

## 2021-05-06 ENCOUNTER — Telehealth: Payer: Self-pay | Admitting: Oncology

## 2021-05-06 ENCOUNTER — Telehealth: Payer: Self-pay

## 2021-05-06 DIAGNOSIS — C4A9 Merkel cell carcinoma, unspecified: Secondary | ICD-10-CM

## 2021-05-06 NOTE — Telephone Encounter (Signed)
Retrurned call to POA/ Brother of pt with appt information for PET,LAb,MD ..KJ ?

## 2021-05-06 NOTE — Telephone Encounter (Signed)
Spoke with patients brother ,Laveda Abbe, who had some questions regarding patients follow up plan. He also wanted to clarify of patient needs to go back to [facility]. Advised per Dr. Tasia Catchings, patient does not need to go back to [facility]. Informed patient Dr Tasia Catchings recommends patient PET scan in July and Labs/MD 2 days after to follow up with results. Laveda Abbe verbalized understanding.  ? ? ? ?Please schedule patient for: ?PET restaging in July  ?Labs/MD 2 days after to go over results.  ?Please notify brother, cecil and [employer] for appt. Thanks  ?

## 2021-05-06 NOTE — Telephone Encounter (Signed)
-----   Message from Daiva Huge, RN sent at 05/05/2021  4:12 PM EDT ----- ?Regarding: POA has questions ?Mr. Whitlows brother and POA has some questions about the next steps with his cancer since he completed radiation.   He was asking about [facility] and if he needs surgery etc.  He is asking if Dr. Tasia Catchings could give him a call back to discuss.  The POA is Martavius Lusty [phone].    ? ?Thanks,  ? ?Ana  ? ?

## 2021-05-25 ENCOUNTER — Encounter: Payer: Self-pay | Admitting: Radiation Oncology

## 2021-05-25 ENCOUNTER — Ambulatory Visit
Admission: RE | Admit: 2021-05-25 | Discharge: 2021-05-25 | Disposition: A | Discharge status: Home / Self Care | Payer: Medicare PPO | Source: Ambulatory Visit | Attending: Radiation Oncology | Admitting: Radiation Oncology

## 2021-05-25 VITALS — BP 125/73 | HR 53 | Temp 98.5°F | Resp 18 | Wt 161.4 lb

## 2021-05-25 DIAGNOSIS — Z923 Personal history of irradiation: Secondary | ICD-10-CM | POA: Diagnosis not present

## 2021-05-25 DIAGNOSIS — C4A9 Merkel cell carcinoma, unspecified: Secondary | ICD-10-CM | POA: Diagnosis not present

## 2021-05-25 NOTE — Progress Notes (Signed)
Radiation Oncology ?Follow up Note ? ?Name: Jason Lin   ?Date:   05/25/2021 ?MRN:  [id number] ?DOB: 1940-12-31  ? ? ?This 81 y.o. 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 ?REFERRING PROVIDER: No ref. provider found ? ?HPI: Patient is an 81 year old male now at 1 month having completed radiation therapy his left axillary nodes in a patient with prior history of Merkel cell carcinomas left upper arm.  He had developed left bulky axillary adenopathy.  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  Seen today in follow-up he complains of upper chest pain.  He states this has been this way for quite a while.  He has been missing some physician appointments which were rescheduling for him including his eye doctor.. ? ?COMPLICATIONS OF TREATMENT: none ? ?FOLLOW UP COMPLIANCE: keeps appointments  ? ?PHYSICAL EXAM:  ?BP 125/73   Pulse (!) 53   Temp 98.5 ?F (36.9 ?C)   Resp 18   Wt 161 lb 6.4 oz (73.2 kg)   BMI 26.86 kg/m?  ?No adenopathy is detected in the left axilla.  Range of motion of his upper extremities does not elicit pain.  Well-developed well-nourished patient in NAD. HEENT reveals PERLA, EOMI, discs not visualized.  Oral cavity is clear. No oral mucosal lesions are identified. Neck is clear without evidence of cervical or supraclavicular adenopathy. Lungs are clear to A&P. Cardiac examination is essentially unremarkable with regular rate and rhythm without murmur rub or thrill. Abdomen is benign with no organomegaly or masses noted. Motor sensory and DTR levels are equal and symmetric in the upper and lower extremities. Cranial nerves II through XII are grossly intact. Proprioception is intact. No peripheral adenopathy or edema is identified. No motor or sensory levels are noted. Crude visual  fields are within normal range. ? ?RADIOLOGY RESULTS: PET scan has been ordered for July ? ?PLAN: At this time I will follow-up with him after his PET scan is performed.  I will also schedule him to be seen by Josh in symptom management for his pain issues.  Follow-up appointment in July was given.  Patient knows to call with any concerns. ? ?I would like to take this opportunity to thank you for allowing me to participate in the care of your patient.. ?  ? Noreene Filbert, MD ? ?

## 2021-05-27 ENCOUNTER — Inpatient Hospital Stay: Payer: Medicare PPO | Admitting: Hospice and Palliative Medicine

## 2021-06-18 ENCOUNTER — Encounter: Payer: Self-pay | Admitting: Podiatry

## 2021-06-18 ENCOUNTER — Ambulatory Visit (INDEPENDENT_AMBULATORY_CARE_PROVIDER_SITE_OTHER): Payer: Medicare PPO | Admitting: Podiatry

## 2021-06-18 DIAGNOSIS — N184 Chronic kidney disease, stage 4 (severe): Secondary | ICD-10-CM

## 2021-06-18 DIAGNOSIS — B351 Tinea unguium: Secondary | ICD-10-CM | POA: Diagnosis not present

## 2021-06-18 DIAGNOSIS — M79675 Pain in left toe(s): Secondary | ICD-10-CM | POA: Diagnosis not present

## 2021-06-18 DIAGNOSIS — M79674 Pain in right toe(s): Secondary | ICD-10-CM | POA: Diagnosis not present

## 2021-06-18 NOTE — Progress Notes (Signed)
This patient returns to my office for at risk foot care.  This patient requires this care by a professional since this patient will be at risk due to having  CKD.  This patient is unable to cut nails himself since the patient cannot reach his nails.These nails are painful walking and wearing shoes.  This patient presents for at risk foot care today.  General Appearance  Alert, conversant and in no acute stress.  Vascular  Dorsalis pedis and posterior tibial  pulses are palpable  bilaterally.  Capillary return is within normal limits  bilaterally. Temperature is within normal limits  bilaterally.  Neurologic  Senn-Weinstein monofilament wire test within normal limits  bilaterally. Muscle power within normal limits bilaterally.  Nails Thick disfigured discolored nails with subungual debris  from hallux to fifth toes bilaterally. No evidence of bacterial infection or drainage bilaterally.  Orthopedic  No limitations of motion  feet .  No crepitus or effusions noted.  No bony pathology or digital deformities noted.  Skin  normotropic skin with no porokeratosis noted bilaterally.  No signs of infections or ulcers noted.     Onychomycosis  Pain in right toes  Pain in left toes  Consent was obtained for treatment procedures.   Mechanical debridement of nails 1-5  bilaterally performed with a nail nipper.  Filed with dremel without incident.    Return office visit     4 months                 Told patient to return for periodic foot care and evaluation due to potential at risk complications.   Gardiner Barefoot DPM

## 2021-07-07 ENCOUNTER — Other Ambulatory Visit: Payer: Medicare PPO

## 2021-07-07 ENCOUNTER — Ambulatory Visit: Payer: Medicare PPO | Admitting: Oncology

## 2021-07-20 ENCOUNTER — Other Ambulatory Visit: Payer: Self-pay

## 2021-07-20 ENCOUNTER — Inpatient Hospital Stay
Admission: EM | Admit: 2021-07-20 | Discharge: 2021-07-23 | DRG: 683 | Disposition: A | Discharge status: Nursing Home (Includes ICF) | Payer: Medicare Other | Source: Skilled Nursing Facility | Attending: Internal Medicine | Admitting: Internal Medicine

## 2021-07-20 ENCOUNTER — Emergency Department: Payer: Medicare Other

## 2021-07-20 ENCOUNTER — Encounter: Payer: Self-pay | Admitting: Emergency Medicine

## 2021-07-20 DIAGNOSIS — C4A9 Merkel cell carcinoma, unspecified: Secondary | ICD-10-CM | POA: Diagnosis present

## 2021-07-20 DIAGNOSIS — M47816 Spondylosis without myelopathy or radiculopathy, lumbar region: Secondary | ICD-10-CM | POA: Diagnosis present

## 2021-07-20 DIAGNOSIS — Z79890 Hormone replacement therapy (postmenopausal): Secondary | ICD-10-CM | POA: Diagnosis not present

## 2021-07-20 DIAGNOSIS — F0390 Unspecified dementia without behavioral disturbance: Secondary | ICD-10-CM | POA: Diagnosis present

## 2021-07-20 DIAGNOSIS — I2510 Atherosclerotic heart disease of native coronary artery without angina pectoris: Secondary | ICD-10-CM | POA: Diagnosis present

## 2021-07-20 DIAGNOSIS — Z91041 Radiographic dye allergy status: Secondary | ICD-10-CM | POA: Diagnosis not present

## 2021-07-20 DIAGNOSIS — R809 Proteinuria, unspecified: Secondary | ICD-10-CM | POA: Diagnosis present

## 2021-07-20 DIAGNOSIS — R1084 Generalized abdominal pain: Secondary | ICD-10-CM

## 2021-07-20 DIAGNOSIS — N179 Acute kidney failure, unspecified: Principal | ICD-10-CM | POA: Diagnosis present

## 2021-07-20 DIAGNOSIS — K219 Gastro-esophageal reflux disease without esophagitis: Secondary | ICD-10-CM | POA: Diagnosis present

## 2021-07-20 DIAGNOSIS — Z85820 Personal history of malignant melanoma of skin: Secondary | ICD-10-CM

## 2021-07-20 DIAGNOSIS — F32A Depression, unspecified: Secondary | ICD-10-CM | POA: Diagnosis present

## 2021-07-20 DIAGNOSIS — Z79899 Other long term (current) drug therapy: Secondary | ICD-10-CM

## 2021-07-20 DIAGNOSIS — N433 Hydrocele, unspecified: Secondary | ICD-10-CM | POA: Diagnosis present

## 2021-07-20 DIAGNOSIS — F0393 Unspecified dementia, unspecified severity, with mood disturbance: Secondary | ICD-10-CM | POA: Diagnosis present

## 2021-07-20 DIAGNOSIS — Z7951 Long term (current) use of inhaled steroids: Secondary | ICD-10-CM

## 2021-07-20 DIAGNOSIS — N189 Chronic kidney disease, unspecified: Secondary | ICD-10-CM | POA: Diagnosis present

## 2021-07-20 DIAGNOSIS — N400 Enlarged prostate without lower urinary tract symptoms: Secondary | ICD-10-CM | POA: Diagnosis present

## 2021-07-20 DIAGNOSIS — C7B1 Secondary Merkel cell carcinoma: Secondary | ICD-10-CM | POA: Diagnosis present

## 2021-07-20 DIAGNOSIS — J449 Chronic obstructive pulmonary disease, unspecified: Secondary | ICD-10-CM | POA: Diagnosis not present

## 2021-07-20 DIAGNOSIS — M940 Chondrocostal junction syndrome [Tietze]: Secondary | ICD-10-CM | POA: Diagnosis not present

## 2021-07-20 DIAGNOSIS — I129 Hypertensive chronic kidney disease with stage 1 through stage 4 chronic kidney disease, or unspecified chronic kidney disease: Secondary | ICD-10-CM | POA: Diagnosis present

## 2021-07-20 DIAGNOSIS — I10 Essential (primary) hypertension: Secondary | ICD-10-CM | POA: Diagnosis present

## 2021-07-20 DIAGNOSIS — N50812 Left testicular pain: Principal | ICD-10-CM

## 2021-07-20 DIAGNOSIS — Z8249 Family history of ischemic heart disease and other diseases of the circulatory system: Secondary | ICD-10-CM

## 2021-07-20 DIAGNOSIS — E785 Hyperlipidemia, unspecified: Secondary | ICD-10-CM | POA: Diagnosis not present

## 2021-07-20 DIAGNOSIS — Z888 Allergy status to other drugs, medicaments and biological substances status: Secondary | ICD-10-CM | POA: Diagnosis not present

## 2021-07-20 DIAGNOSIS — Z923 Personal history of irradiation: Secondary | ICD-10-CM | POA: Diagnosis not present

## 2021-07-20 DIAGNOSIS — N184 Chronic kidney disease, stage 4 (severe): Secondary | ICD-10-CM | POA: Diagnosis present

## 2021-07-20 DIAGNOSIS — N5082 Scrotal pain: Secondary | ICD-10-CM

## 2021-07-20 HISTORY — DX: Merkel cell carcinoma, unspecified: C4A.9

## 2021-07-20 HISTORY — DX: Unspecified dementia, unspecified severity, without behavioral disturbance, psychotic disturbance, mood disturbance, and anxiety: F03.90

## 2021-07-20 LAB — TROPONIN I (HIGH SENSITIVITY)
Troponin I (High Sensitivity): 5 ng/L (ref <18)
Troponin I (High Sensitivity): 6 ng/L (ref <18)

## 2021-07-20 LAB — URINALYSIS, ROUTINE W REFLEX MICROSCOPIC
Bilirubin Urine: NEGATIVE
Glucose, UA: NEGATIVE mg/dL
Hgb urine dipstick: NEGATIVE
Ketones, ur: NEGATIVE mg/dL
Leukocytes,Ua: NEGATIVE
Nitrite: NEGATIVE
Protein, ur: 100 mg/dL — AB
Specific Gravity, Urine: 1.019 (ref 1.005–1.030)
Squamous Epithelial / HPF: NONE SEEN (ref 0–5)
pH: 5 (ref 5.0–8.0)

## 2021-07-20 LAB — COMPREHENSIVE METABOLIC PANEL
ALT: 15 U/L (ref 0–44)
AST: 13 U/L — ABNORMAL LOW (ref 15–41)
Albumin: 3.9 g/dL (ref 3.5–5.0)
Alkaline Phosphatase: 33 U/L — ABNORMAL LOW (ref 38–126)
Anion gap: 6 (ref 5–15)
BUN: 34 mg/dL — ABNORMAL HIGH (ref 8–23)
CO2: 23 mmol/L (ref 22–32)
Calcium: 9.3 mg/dL (ref 8.9–10.3)
Chloride: 110 mmol/L (ref 98–111)
Creatinine, Ser: 2.81 mg/dL — ABNORMAL HIGH (ref 0.61–1.24)
GFR, Estimated: 22 mL/min — ABNORMAL LOW (ref >60)
Glucose, Bld: 83 mg/dL (ref 70–99)
Potassium: 4.5 mmol/L (ref 3.5–5.1)
Sodium: 139 mmol/L (ref 135–145)
Total Bilirubin: 1.1 mg/dL (ref 0.3–1.2)
Total Protein: 6.5 g/dL (ref 6.5–8.1)

## 2021-07-20 LAB — CBC
HCT: 36 % — ABNORMAL LOW (ref 39.0–52.0)
Hemoglobin: 11.4 g/dL — ABNORMAL LOW (ref 13.0–17.0)
MCH: 31.2 pg (ref 26.0–34.0)
MCHC: 31.7 g/dL (ref 30.0–36.0)
MCV: 98.6 fL (ref 80.0–100.0)
Platelets: 147 10*3/uL — ABNORMAL LOW (ref 150–400)
RBC: 3.65 MIL/uL — ABNORMAL LOW (ref 4.22–5.81)
RDW: 12.7 % (ref 11.5–15.5)
WBC: 4.1 10*3/uL (ref 4.0–10.5)
nRBC: 0 % (ref 0.0–0.2)

## 2021-07-20 LAB — LIPASE, BLOOD: Lipase: 38 U/L (ref 11–51)

## 2021-07-20 MED ORDER — MOMETASONE FURO-FORMOTEROL FUM 100-5 MCG/ACT IN AERO
2.0000 | INHALATION_SPRAY | Freq: Two times a day (BID) | RESPIRATORY_TRACT | Status: DC
  Administered 2021-07-21 – 2021-07-23 (×5): 2 via RESPIRATORY_TRACT
  Filled 2021-07-20: qty 8.8

## 2021-07-20 MED ORDER — MELATONIN 5 MG PO TABS
5.0000 mg | ORAL_TABLET | Freq: Every day | ORAL | Status: DC
  Administered 2021-07-20 – 2021-07-22 (×3): 5 mg via ORAL
  Filled 2021-07-20 (×3): qty 1

## 2021-07-20 MED ORDER — OXYCODONE-ACETAMINOPHEN 5-325 MG PO TABS
1.0000 | ORAL_TABLET | ORAL | Status: DC | PRN
  Administered 2021-07-20 – 2021-07-23 (×6): 1 via ORAL
  Filled 2021-07-20 (×6): qty 1

## 2021-07-20 MED ORDER — ACETAMINOPHEN 500 MG PO TABS
1000.0000 mg | ORAL_TABLET | Freq: Three times a day (TID) | ORAL | Status: DC | PRN
  Administered 2021-07-20: 1000 mg via ORAL
  Filled 2021-07-20: qty 2

## 2021-07-20 MED ORDER — MORPHINE SULFATE (PF) 4 MG/ML IV SOLN
4.0000 mg | Freq: Once | INTRAVENOUS | Status: AC
  Administered 2021-07-20: 4 mg via INTRAVENOUS
  Filled 2021-07-20: qty 1

## 2021-07-20 MED ORDER — LIDOCAINE 5 % EX PTCH
1.0000 | MEDICATED_PATCH | CUTANEOUS | Status: DC
  Administered 2021-07-20 – 2021-07-22 (×3): 1 via TRANSDERMAL
  Filled 2021-07-20 (×4): qty 1

## 2021-07-20 MED ORDER — TRAZODONE HCL 50 MG PO TABS
100.0000 mg | ORAL_TABLET | Freq: Every day | ORAL | Status: DC
  Administered 2021-07-20 – 2021-07-22 (×3): 100 mg via ORAL
  Filled 2021-07-20 (×3): qty 2

## 2021-07-20 MED ORDER — TAMSULOSIN HCL 0.4 MG PO CAPS
0.4000 mg | ORAL_CAPSULE | Freq: Every day | ORAL | Status: DC
  Administered 2021-07-20 – 2021-07-22 (×3): 0.4 mg via ORAL
  Filled 2021-07-20 (×3): qty 1

## 2021-07-20 MED ORDER — OXYCODONE HCL 5 MG PO TABS
2.5000 mg | ORAL_TABLET | Freq: Four times a day (QID) | ORAL | Status: DC | PRN
  Administered 2021-07-20 – 2021-07-23 (×6): 2.5 mg via ORAL
  Filled 2021-07-20 (×6): qty 1

## 2021-07-20 MED ORDER — ALBUTEROL SULFATE (2.5 MG/3ML) 0.083% IN NEBU
3.0000 mL | INHALATION_SOLUTION | Freq: Four times a day (QID) | RESPIRATORY_TRACT | Status: DC | PRN
Start: 2021-07-20 — End: 2021-07-23

## 2021-07-20 MED ORDER — CALCIUM CARBONATE ANTACID 500 MG PO CHEW
1.0000 | CHEWABLE_TABLET | Freq: Two times a day (BID) | ORAL | Status: DC
Start: 2021-07-20 — End: 2021-07-23
  Administered 2021-07-20 – 2021-07-23 (×6): 200 mg via ORAL
  Filled 2021-07-20 (×6): qty 1

## 2021-07-20 MED ORDER — SIMVASTATIN 20 MG PO TABS
20.0000 mg | ORAL_TABLET | Freq: Every day | ORAL | Status: DC
  Administered 2021-07-20 – 2021-07-22 (×3): 20 mg via ORAL
  Filled 2021-07-20 (×3): qty 1

## 2021-07-20 MED ORDER — LORATADINE 10 MG PO TABS
10.0000 mg | ORAL_TABLET | Freq: Every day | ORAL | Status: DC
  Administered 2021-07-20 – 2021-07-23 (×4): 10 mg via ORAL
  Filled 2021-07-20 (×4): qty 1

## 2021-07-20 MED ORDER — MEDROXYPROGESTERONE ACETATE 2.5 MG PO TABS
5.0000 mg | ORAL_TABLET | ORAL | Status: DC
  Administered 2021-07-20 – 2021-07-23 (×6): 5 mg via ORAL
  Filled 2021-07-20 (×6): qty 2

## 2021-07-20 MED ORDER — ONDANSETRON HCL 4 MG/2ML IJ SOLN
4.0000 mg | Freq: Once | INTRAMUSCULAR | Status: AC
  Administered 2021-07-20: 4 mg via INTRAVENOUS
  Filled 2021-07-20: qty 2

## 2021-07-20 MED ORDER — SODIUM CHLORIDE 0.45 % IV SOLN: INTRAVENOUS | Status: AC

## 2021-07-20 MED ORDER — NITROGLYCERIN 0.4 MG SL SUBL: 0.4000 mg | SUBLINGUAL_TABLET | SUBLINGUAL | Status: DC | PRN

## 2021-07-20 MED ORDER — GABAPENTIN 100 MG PO CAPS
200.0000 mg | ORAL_CAPSULE | Freq: Every day | ORAL | Status: DC
  Administered 2021-07-20 – 2021-07-22 (×3): 200 mg via ORAL
  Filled 2021-07-20 (×3): qty 2

## 2021-07-20 MED ORDER — PSYLLIUM 0.52 G PO CAPS
0.5200 g | ORAL_CAPSULE | Freq: Every day | ORAL | Status: DC
Start: 2021-07-20 — End: 2021-07-20

## 2021-07-20 MED ORDER — POLYETHYLENE GLYCOL 3350 17 G PO PACK
17.0000 g | PACK | Freq: Every day | ORAL | Status: DC
  Administered 2021-07-20 – 2021-07-23 (×4): 17 g via ORAL
  Filled 2021-07-20 (×4): qty 1

## 2021-07-20 MED ORDER — LOPERAMIDE HCL 2 MG PO CAPS: 2.0000 mg | ORAL_CAPSULE | Freq: Two times a day (BID) | ORAL | Status: DC | PRN

## 2021-07-20 MED ORDER — PSYLLIUM 95 % PO PACK
1.0000 | PACK | Freq: Every day | ORAL | Status: DC
  Administered 2021-07-21 – 2021-07-23 (×3): 1 via ORAL
  Filled 2021-07-20 (×3): qty 1

## 2021-07-20 MED ORDER — SENNOSIDES-DOCUSATE SODIUM 8.6-50 MG PO TABS
2.0000 | ORAL_TABLET | Freq: Two times a day (BID) | ORAL | Status: DC
  Administered 2021-07-20 – 2021-07-23 (×4): 2 via ORAL
  Filled 2021-07-20 (×5): qty 2

## 2021-07-20 MED ORDER — DORZOLAMIDE HCL-TIMOLOL MAL 2-0.5 % OP SOLN
1.0000 [drp] | Freq: Two times a day (BID) | OPHTHALMIC | Status: DC
  Administered 2021-07-20 – 2021-07-23 (×6): 1 [drp] via OPHTHALMIC
  Filled 2021-07-20: qty 10

## 2021-07-20 MED ORDER — FLUTICASONE PROPIONATE 50 MCG/ACT NA SUSP
2.0000 | Freq: Every day | NASAL | Status: DC
  Administered 2021-07-21 – 2021-07-23 (×3): 2 via NASAL
  Filled 2021-07-20: qty 16

## 2021-07-20 MED ORDER — PANTOPRAZOLE SODIUM 40 MG PO TBEC
40.0000 mg | DELAYED_RELEASE_TABLET | Freq: Every day | ORAL | Status: DC
Start: 2021-07-20 — End: 2021-07-23
  Administered 2021-07-20 – 2021-07-23 (×4): 40 mg via ORAL
  Filled 2021-07-20 (×4): qty 1

## 2021-07-20 MED ORDER — FLUOXETINE HCL 20 MG PO CAPS
20.0000 mg | ORAL_CAPSULE | Freq: Every day | ORAL | Status: DC
  Administered 2021-07-20 – 2021-07-23 (×4): 20 mg via ORAL
  Filled 2021-07-20 (×4): qty 1

## 2021-07-20 MED ORDER — LATANOPROST 0.005 % OP SOLN
1.0000 [drp] | Freq: Every day | OPHTHALMIC | Status: DC
  Administered 2021-07-20 – 2021-07-22 (×3): 1 [drp] via OPHTHALMIC
  Filled 2021-07-20: qty 2.5

## 2021-07-20 MED ORDER — LACTATED RINGERS IV BOLUS
1000.0000 mL | Freq: Once | INTRAVENOUS | Status: AC
  Administered 2021-07-20: 1000 mL via INTRAVENOUS

## 2021-07-20 MED ORDER — ENOXAPARIN SODIUM 30 MG/0.3ML IJ SOSY
30.0000 mg | PREFILLED_SYRINGE | INTRAMUSCULAR | Status: DC
  Administered 2021-07-20 – 2021-07-22 (×3): 30 mg via SUBCUTANEOUS
  Filled 2021-07-20 (×3): qty 0.3
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
## 2021-07-20 NOTE — Assessment & Plan Note (Signed)
Continue statin therapy.

## 2021-07-20 NOTE — ED Notes (Signed)
Receiving RN Izora Gala has agreed to accept TOC once pt has arrived to inpatient unit, all questions and concerns address.

## 2021-07-20 NOTE — ED Notes (Signed)
Pt resting and watching TV, NAD noted, even RR and unlabored, call bell within reach for assistance, side rails up x2 for safety, pt voiced no concerns or questions at this time, care on going, will continue to monitor.  

## 2021-07-21 DIAGNOSIS — Z79899 Other long term (current) drug therapy: Secondary | ICD-10-CM | POA: Diagnosis not present

## 2021-07-21 DIAGNOSIS — K219 Gastro-esophageal reflux disease without esophagitis: Secondary | ICD-10-CM | POA: Diagnosis not present

## 2021-07-21 DIAGNOSIS — I10 Essential (primary) hypertension: Secondary | ICD-10-CM

## 2021-07-21 DIAGNOSIS — M47816 Spondylosis without myelopathy or radiculopathy, lumbar region: Secondary | ICD-10-CM | POA: Diagnosis not present

## 2021-07-21 DIAGNOSIS — N178 Other acute kidney failure: Secondary | ICD-10-CM | POA: Diagnosis not present

## 2021-07-21 DIAGNOSIS — N400 Enlarged prostate without lower urinary tract symptoms: Secondary | ICD-10-CM | POA: Diagnosis not present

## 2021-07-21 DIAGNOSIS — F32A Depression, unspecified: Secondary | ICD-10-CM | POA: Diagnosis not present

## 2021-07-21 DIAGNOSIS — I129 Hypertensive chronic kidney disease with stage 1 through stage 4 chronic kidney disease, or unspecified chronic kidney disease: Secondary | ICD-10-CM | POA: Diagnosis not present

## 2021-07-21 DIAGNOSIS — Z7951 Long term (current) use of inhaled steroids: Secondary | ICD-10-CM | POA: Diagnosis not present

## 2021-07-21 DIAGNOSIS — M940 Chondrocostal junction syndrome [Tietze]: Secondary | ICD-10-CM | POA: Diagnosis not present

## 2021-07-21 DIAGNOSIS — Z923 Personal history of irradiation: Secondary | ICD-10-CM | POA: Diagnosis not present

## 2021-07-21 DIAGNOSIS — C4A9 Merkel cell carcinoma, unspecified: Secondary | ICD-10-CM

## 2021-07-21 DIAGNOSIS — Z85820 Personal history of malignant melanoma of skin: Secondary | ICD-10-CM | POA: Diagnosis not present

## 2021-07-21 DIAGNOSIS — N189 Chronic kidney disease, unspecified: Secondary | ICD-10-CM | POA: Diagnosis present

## 2021-07-21 DIAGNOSIS — N184 Chronic kidney disease, stage 4 (severe): Secondary | ICD-10-CM

## 2021-07-21 DIAGNOSIS — R809 Proteinuria, unspecified: Secondary | ICD-10-CM | POA: Diagnosis not present

## 2021-07-21 DIAGNOSIS — F0393 Unspecified dementia, unspecified severity, with mood disturbance: Secondary | ICD-10-CM | POA: Diagnosis not present

## 2021-07-21 DIAGNOSIS — N179 Acute kidney failure, unspecified: Secondary | ICD-10-CM | POA: Diagnosis present

## 2021-07-21 DIAGNOSIS — Z91041 Radiographic dye allergy status: Secondary | ICD-10-CM | POA: Diagnosis not present

## 2021-07-21 DIAGNOSIS — Z79890 Hormone replacement therapy (postmenopausal): Secondary | ICD-10-CM | POA: Diagnosis not present

## 2021-07-21 DIAGNOSIS — N433 Hydrocele, unspecified: Secondary | ICD-10-CM | POA: Diagnosis not present

## 2021-07-21 DIAGNOSIS — E785 Hyperlipidemia, unspecified: Secondary | ICD-10-CM | POA: Diagnosis not present

## 2021-07-21 DIAGNOSIS — Z8249 Family history of ischemic heart disease and other diseases of the circulatory system: Secondary | ICD-10-CM | POA: Diagnosis not present

## 2021-07-21 DIAGNOSIS — J449 Chronic obstructive pulmonary disease, unspecified: Secondary | ICD-10-CM | POA: Diagnosis not present

## 2021-07-21 DIAGNOSIS — C7B1 Secondary Merkel cell carcinoma: Secondary | ICD-10-CM | POA: Diagnosis not present

## 2021-07-21 DIAGNOSIS — N50812 Left testicular pain: Secondary | ICD-10-CM | POA: Diagnosis present

## 2021-07-21 DIAGNOSIS — Z888 Allergy status to other drugs, medicaments and biological substances status: Secondary | ICD-10-CM | POA: Diagnosis not present

## 2021-07-21 DIAGNOSIS — I2510 Atherosclerotic heart disease of native coronary artery without angina pectoris: Secondary | ICD-10-CM | POA: Diagnosis not present

## 2021-07-21 LAB — BASIC METABOLIC PANEL
Anion gap: 5 (ref 5–15)
BUN: 33 mg/dL — ABNORMAL HIGH (ref 8–23)
CO2: 24 mmol/L (ref 22–32)
Calcium: 9 mg/dL (ref 8.9–10.3)
Chloride: 112 mmol/L — ABNORMAL HIGH (ref 98–111)
Creatinine, Ser: 2.55 mg/dL — ABNORMAL HIGH (ref 0.61–1.24)
GFR, Estimated: 25 mL/min — ABNORMAL LOW (ref >60)
Glucose, Bld: 87 mg/dL (ref 70–99)
Potassium: 5 mmol/L (ref 3.5–5.1)
Sodium: 141 mmol/L (ref 135–145)

## 2021-07-21 MED ORDER — LACTATED RINGERS IV SOLN: INTRAVENOUS | Status: DC

## 2021-07-22 DIAGNOSIS — N178 Other acute kidney failure: Secondary | ICD-10-CM | POA: Diagnosis not present

## 2021-07-22 DIAGNOSIS — N179 Acute kidney failure, unspecified: Secondary | ICD-10-CM | POA: Diagnosis not present

## 2021-07-22 DIAGNOSIS — I10 Essential (primary) hypertension: Secondary | ICD-10-CM | POA: Diagnosis not present

## 2021-07-22 DIAGNOSIS — N50812 Left testicular pain: Secondary | ICD-10-CM | POA: Diagnosis not present

## 2021-07-22 DIAGNOSIS — N184 Chronic kidney disease, stage 4 (severe): Secondary | ICD-10-CM | POA: Diagnosis not present

## 2021-07-22 LAB — BASIC METABOLIC PANEL
Anion gap: 6 (ref 5–15)
BUN: 35 mg/dL — ABNORMAL HIGH (ref 8–23)
CO2: 24 mmol/L (ref 22–32)
Calcium: 8.7 mg/dL — ABNORMAL LOW (ref 8.9–10.3)
Chloride: 112 mmol/L — ABNORMAL HIGH (ref 98–111)
Creatinine, Ser: 2.67 mg/dL — ABNORMAL HIGH (ref 0.61–1.24)
GFR, Estimated: 23 mL/min — ABNORMAL LOW (ref >60)
Glucose, Bld: 97 mg/dL (ref 70–99)
Potassium: 4.8 mmol/L (ref 3.5–5.1)
Sodium: 142 mmol/L (ref 135–145)

## 2021-07-22 LAB — MAGNESIUM: Magnesium: 2 mg/dL (ref 1.7–2.4)

## 2021-07-22 MED ORDER — LACTULOSE 10 GM/15ML PO SOLN
20.0000 g | Freq: Once | ORAL | Status: AC
  Administered 2021-07-22: 20 g via ORAL
  Filled 2021-07-22: qty 30
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
## 2021-07-22 NOTE — Discharge Summary (Addendum)
Physician Discharge Summary   Patient: Jason Lin MRN: [id number] DOB: 12-Nov-1940  Admit date:     07/20/2021  Discharge date: 07/22/21  Discharge Physician: Sharen Hones   PCP: Pcp, No   Recommendations at discharge:   Follow-up with nephrology in 1 week. Follow-up with PCP in 1 week.  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 [facility] Course: 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 He was sent to [facility] for evaluation of left testicular pain, abdominal pain-diffuse and chest pain and question of worsening renal function. Patient was given IV fluids, bladder scan did not show any urinary retention. Renal function slightly better, but did not improve to baseline last year.  I consulted nephrology, looks like this could be due to natural progression renal function.  No additional work-up is needed.  At this point, he is medically stable to be discharged.  Assessment and Plan: Acute renal failure on chronic kidney disease stage IV. Patient currently doing well, he received IV fluids, renal function did not improve to the baseline as of last year.  Discussed with nephrology, most likely this is due to progression renal disease.  Patient did not have any urinary retention on bladder scan.  He will be followed by nephrology as outpatient.  Currently he is medically stable to be discharged.   Pain in the scrotum secondary to hydrocele. There is no scrotum edema.  This can be followed as outpatient with  urology.   Merkel cell cancer. Completed radiation therapy.  Essential hypertension Blood pressure stable.   Coronary artery disease. Dyslipidemia Stable.  Addendum: Patient does not want to leave the hospital today, he want to see a urologist for his testicle pain.  We will consult urologist before discharge.  Delay discharge until tomorrow.     Consultants: Nephrology Procedures performed: None  Disposition: Assisted living Diet recommendation:  Discharge Diet Orders (From admission, onward)     Start     Ordered   07/22/21 0000  Diet - low sodium heart healthy        07/22/21 1055           Cardiac diet DISCHARGE MEDICATION: Allergies as of 07/22/2021       Reactions   Brimonidine Itching   Timolol Itching   Iodinated Contrast Media Other (See Comments)        Medication List     STOP taking these medications    escitalopram 10 MG tablet Commonly known as: LEXAPRO   lisinopril 5 MG tablet Commonly known as: ZESTRIL   predniSONE 20 MG tablet Commonly known as: DELTASONE       TAKE these medications    acetaminophen 500 MG tablet Commonly known as: TYLENOL Take 1,000 mg by mouth every 8 (eight) hours as needed.   Advair Diskus 100-50 MCG/ACT Aepb Generic drug: fluticasone-salmeterol Inhale 1 puff into the lungs 2 (two) times daily.   albuterol 108 (90 Base) MCG/ACT inhaler Commonly known as: VENTOLIN HFA Inhale 2 puffs into the lungs every 6 (six) hours  as needed for wheezing or shortness of breath.   Biofreeze 4 % Gel Generic drug: Menthol (Topical Analgesic) Apply 1 Application topically in the morning, at noon, and at bedtime.   bisacodyl 10 MG suppository Commonly known as: DULCOLAX Place 10 mg rectally every three (3) days as needed for moderate constipation.   calcium carbonate 500 MG chewable tablet Commonly known as: TUMS - dosed in mg elemental calcium Chew 1 tablet by mouth 2 (two) times daily.   dorzolamide-timolol  22.3-6.8 MG/ML ophthalmic solution Commonly known as: COSOPT Place 1 drop into both eyes every 12 (twelve) hours.   fexofenadine 60 MG tablet Commonly known as: ALLEGRA Take 60 mg by mouth daily as needed (itching).   FLUoxetine 20 MG capsule Commonly known as: PROZAC Take 20 mg by mouth daily.   fluticasone 50 MCG/ACT nasal spray Commonly known as: FLONASE Place 2 sprays into both nostrils daily.   gabapentin 100 MG capsule Commonly known as: NEURONTIN Take 200 mg by mouth at bedtime.   guaiFENesin 100 MG/5ML liquid Commonly known as: ROBITUSSIN Take 10 mLs by mouth every 4 (four) hours as needed for cough or to loosen phlegm.   latanoprost 0.005 % ophthalmic solution Commonly known as: XALATAN 1 drop at bedtime.   lidocaine 4 % cream Commonly known as: LMX Apply 1 Application topically daily as needed.   loperamide 2 MG tablet Commonly known as: IMODIUM A-D Take 1 mg by mouth 2 (two) times daily as needed for diarrhea or loose stools.   loratadine 10 MG tablet Commonly known as: CLARITIN Take 10 mg by mouth daily.   medroxyPROGESTERone 5 MG tablet Commonly known as: PROVERA Take 5 mg by mouth in the morning and at bedtime.   melatonin 3 MG Tabs tablet Take 6 mg by mouth at bedtime.   nitroGLYCERIN 0.4 MG SL tablet Commonly known as: NITROSTAT Place 0.4 mg under the tongue every 5 (five) minutes as needed for chest pain.   oxyCODONE 5 MG immediate release tablet Commonly known as: Oxy IR/ROXICODONE Take 2.5 mg by mouth every 6 (six) hours as needed for severe pain.   polyethylene glycol 17 g packet Commonly known as: MIRALAX / GLYCOLAX Take 17 g by mouth daily.   psyllium 0.52 g capsule Commonly known as: REGULOID Take 0.52 g by mouth daily.   senna-docusate 8.6-50 MG tablet Commonly known as: Senokot-S Take 2 tablets by mouth 2 (two) times daily.   simvastatin 20 MG tablet Commonly known as: ZOCOR Take 20 mg by mouth daily.   tamsulosin 0.4 MG  Caps capsule Commonly known as: FLOMAX Take 0.4 mg by mouth daily after supper.   traZODone 100 MG tablet Commonly known as: DESYREL Take 100 mg by mouth at bedtime.        Follow-up Information     Lateef, Munsoor, MD Follow up in 1 week(s).   Specialty: Nephrology Contact information: [place] [address] [phone]                Discharge Exam: Filed Weights   07/20/21 1519  Weight: 73 kg   General exam: Appears calm and comfortable  Respiratory system: Clear to auscultation. Respiratory effort normal. Cardiovascular system: S1 & S2 heard, RRR. No JVD, murmurs, rubs, gallops or clicks. No pedal edema. Gastrointestinal system: Abdomen is nondistended, soft and nontender. No organomegaly or masses felt. Normal bowel sounds heard. Central nervous system: Alert and oriented. No focal neurological deficits. Extremities: Symmetric 5 x 5  power. Skin: No rashes, lesions or ulcers Psychiatry: Judgement and insight appear normal. Mood & affect appropriate.    Condition at discharge: fair  The results of significant diagnostics from this hospitalization (including imaging, microbiology, ancillary and laboratory) are listed below for reference.   Imaging Studies: US SCROTUM W/DOPPLER  Result Date: 07/20/2021 CLINICAL DATA:  Left testicular pain.  History of lymphoma EXAM: SCROTAL ULTRASOUND DOPPLER ULTRASOUND OF THE TESTICLES TECHNIQUE: Complete ultrasound examination of the testicles, epididymis, and other scrotal structures was performed. Color and spectral Doppler ultrasound were also utilized to evaluate blood flow to the testicles. COMPARISON:  PET-CT 03/03/2021 FINDINGS: Right testicle Measurements: 4.0 x 2.3 x 3.1 cm. No mass or microlithiasis visualized. Left testicle Measurements: 4.1 x 1.9 x 3.3 cm. No mass or microlithiasis visualized. Right epididymis:  Normal in size and appearance. Left epididymis:  Normal in size and appearance.  Hydrocele:  Small bilateral hydroceles. Varicocele:  None visualized. Pulsed Doppler interrogation of both testes demonstrates normal low resistance arterial and venous waveforms bilaterally. IMPRESSION: 1. Negative for testicular torsion or intratesticular mass. 2. Small bilateral hydroceles. Electronically Signed   By: Davina Poke D.O.   On: 07/20/2021 12:58   CT Abdomen Pelvis Wo Contrast  Result Date: 07/20/2021 CLINICAL DATA:  Abdominal pain, acute, nonlocalized EXAM: CT ABDOMEN AND PELVIS WITHOUT CONTRAST TECHNIQUE: Multidetector CT imaging of the abdomen and pelvis was performed following the standard protocol without IV contrast. RADIATION DOSE REDUCTION: This exam was performed according to the departmental dose-optimization program which includes automated exposure control, adjustment of the mA and/or kV according to patient size and/or use of iterative reconstruction technique. COMPARISON:  None Available. FINDINGS: Lower chest: No acute abnormality. Hepatobiliary: No focal liver abnormality is seen. No gallstones, gallbladder wall thickening, or biliary dilatation. Pancreas: Unremarkable. Spleen: Unremarkable. Adrenals/Urinary Tract: Adrenals are unremarkable. Small cyst of the lower pole the right kidney. No calculi. Bladder is poorly distended but appears unremarkable. Stomach/Bowel: Stomach is within normal limits. Bowel is normal in caliber. Mild sigmoid diverticulosis. Vascular/Lymphatic: Atherosclerosis.  No enlarged nodes. Reproductive: Unremarkable. Other: No free fluid.  Abdominal wall is unremarkable. Musculoskeletal: Advanced degenerative changes of the included spine. IMPRESSION: No acute abnormality. Mild colonic sigmoid diverticulosis. Atherosclerosis. Electronically Signed   By: Macy Mis M.D.   On: 07/20/2021 11:44    Microbiology: Results for orders placed or performed during the hospital encounter of 02/23/21  Resp Panel by RT-PCR (Flu A&B, Covid) Nasopharyngeal Swab      Status: None   Collection Time: 02/23/21  1:21 PM   Specimen: Nasopharyngeal Swab; Nasopharyngeal(NP) swabs in vial transport medium  Result Value Ref Range Status   SARS Coronavirus 2 by RT PCR NEGATIVE NEGATIVE Final    Comment: (NOTE) SARS-CoV-2 target nucleic acids are NOT DETECTED.  The SARS-CoV-2 RNA is generally detectable in upper respiratory specimens during the acute phase of infection. The lowest concentration of SARS-CoV-2 viral copies this assay can detect is 138 copies/mL. A negative result does not preclude SARS-Cov-2 infection and should not be used as the sole basis for treatment or other patient management decisions. A negative result may occur with  improper specimen collection/handling, submission of specimen other than nasopharyngeal swab, presence of viral mutation(s) within the areas targeted by this assay, and inadequate number of viral copies(<138 copies/mL). A negative result must be combined with clinical observations, patient history, and epidemiological information. The expected result is Negative.  Fact Sheet for Patients:  EntrepreneurPulse.com.au  Fact Sheet for Healthcare Providers:  IncredibleEmployment.be  This test is no t yet approved or cleared by the [place] and  has been authorized for detection and/or diagnosis of SARS-CoV-2 by FDA under an Emergency Use Authorization (EUA). This EUA will remain  in effect (meaning this test can be used) for the duration of the COVID-19 declaration under Section 564(b)(1) of the Act, 21 U.S.C.section 360bbb-3(b)(1), unless the authorization is terminated  or revoked sooner.       Influenza A by PCR NEGATIVE NEGATIVE Final   Influenza B by PCR NEGATIVE NEGATIVE Final    Comment: (NOTE) The Xpert Xpress SARS-CoV-2/FLU/RSV plus assay is intended as an aid in the diagnosis of influenza from Nasopharyngeal swab specimens and should not be used as a sole basis  for treatment. Nasal washings and aspirates are unacceptable for Xpert Xpress SARS-CoV-2/FLU/RSV testing.  Fact Sheet for Patients: EntrepreneurPulse.com.au  Fact Sheet for Healthcare Providers: IncredibleEmployment.be  This test is not yet approved or cleared by the [place] FDA and has been authorized for detection and/or diagnosis of SARS-CoV-2 by FDA under an Emergency Use Authorization (EUA). This EUA will remain in effect (meaning this test can be used) for the duration of the COVID-19 declaration under Section 564(b)(1) of the Act, 21 U.S.C. section 360bbb-3(b)(1), unless the authorization is terminated or revoked.  Performed at [facility], [address] [place], [place] [address]     Labs: CBC: Recent Labs  Lab 07/20/21 0932  WBC 4.1  HGB 11.4*  HCT 36.0*  MCV 98.6  PLT 462*   Basic Metabolic Panel: Recent Labs  Lab 07/20/21 0932 07/21/21 0449 07/22/21 0439  NA 139 141 142  K 4.5 5.0 4.8  CL 110 112* 112*  CO2 '23 24 24  '$ GLUCOSE 83 87 97  BUN 34* 33* 35*  CREATININE 2.81* 2.55* 2.67*  CALCIUM 9.3 9.0 8.7*  MG  --   --  2.0   Liver Function Tests: Recent Labs  Lab 07/20/21 0932  AST 13*  ALT 15  ALKPHOS 33*  BILITOT 1.1  PROT 6.5  ALBUMIN 3.9   CBG: No results for input(s): "GLUCAP" in the last 168 hours.  Discharge time spent: greater than 30 minutes.  Signed: Sharen Hones, MD [facility] 07/22/2021

## 2021-07-22 NOTE — Care Management Important Message (Signed)
Important Message  Patient Details  Name: Jason Lin MRN: [id number] Date of Birth: 1941-01-21   Medicare Important Message Given:  Yes  I reviewed the Important Message from Medicare with the patient and he signed a copy and I will have it scanned into his medical record.  Patient has some pain and has talked with his brother and will decide if he would like to appeal.  A copy left with the patient with Kepro's number circled as he stated he can only see well out of one eye. I wished him well and he thanked me for my time.   Juliann Pulse A Strummer Canipe 07/22/2021, 1:28 PM

## 2021-07-22 NOTE — Discharge Instructions (Signed)
Follow-up with your primary care doctor in 1 week. Follow-up with nephrology in 1 week.

## 2021-07-22 NOTE — TOC Progression Note (Addendum)
Transition of Care [facility]) - Progression Note    Patient Details  Name: Jason Lin MRN: [id number] Date of Birth: [birth year]  Transition of Care [facility] CM/SW [place], RN Phone Number: 07/22/2021, 3:09 PM  Clinical Narrative:   Patient was initially discharged today.  Patient stated he is still in pain and does not feel issues are resolved.  RNCM contacted care team and hospitalist.  Hospitalist will provide further consultation and has cancelled discharge for today.  [facility] aware of discharge, then of discharge delay.  RNCM spoke with patient's brother, who states he is in agreement with patient staying one more night.  Patient's brother was just discharged from hospital with fractured hip, so he is not able to see patient, but did speak with him over the phone.  Brother states he is the power of attorney and [employer] has records of this.  Patient and family amenable with patient staying for further consultation at this time.  TOC will follow.    Expected Discharge Plan: Assisted Living Barriers to Discharge: Continued Medical Work up  Expected Discharge Plan and Services Expected Discharge Plan: Assisted Living       Living arrangements for the past 2 months: [place] Expected Discharge Date: 07/22/21                                     Social Determinants of Health (SDOH) Interventions    Readmission Risk Interventions     No data to display

## 2021-07-23 DIAGNOSIS — N184 Chronic kidney disease, stage 4 (severe): Secondary | ICD-10-CM | POA: Diagnosis not present

## 2021-07-23 DIAGNOSIS — N178 Other acute kidney failure: Secondary | ICD-10-CM | POA: Diagnosis not present

## 2021-07-23 DIAGNOSIS — N5082 Scrotal pain: Secondary | ICD-10-CM | POA: Diagnosis not present

## 2021-07-23 DIAGNOSIS — C4A9 Merkel cell carcinoma, unspecified: Secondary | ICD-10-CM | POA: Diagnosis not present

## 2021-07-23 DIAGNOSIS — N179 Acute kidney failure, unspecified: Secondary | ICD-10-CM | POA: Diagnosis not present

## 2021-07-23 DIAGNOSIS — N50812 Left testicular pain: Secondary | ICD-10-CM | POA: Diagnosis not present

## 2021-07-23 LAB — BASIC METABOLIC PANEL
Anion gap: 4 — ABNORMAL LOW (ref 5–15)
BUN: 31 mg/dL — ABNORMAL HIGH (ref 8–23)
CO2: 26 mmol/L (ref 22–32)
Calcium: 8.8 mg/dL — ABNORMAL LOW (ref 8.9–10.3)
Chloride: 109 mmol/L (ref 98–111)
Creatinine, Ser: 2.52 mg/dL — ABNORMAL HIGH (ref 0.61–1.24)
GFR, Estimated: 25 mL/min — ABNORMAL LOW (ref >60)
Glucose, Bld: 97 mg/dL (ref 70–99)
Potassium: 4.6 mmol/L (ref 3.5–5.1)
Sodium: 139 mmol/L (ref 135–145)

## 2021-07-23 NOTE — NC FL2 (Signed)
[place] LEVEL OF CARE SCREENING TOOL     IDENTIFICATION  Patient Name: Jason Lin Birthdate: Feb 27, 1940 Sex: male Admission Date (Current Location): 07/20/2021  [facility] and [place] Number:  Engineering geologist and Address:  [facility], [address] [place], [place] [address]      Provider Number: [id number]  Attending Physician Name and Address:  Sharen Hones, MD  Relative Name and Phone Number:  Schiele,Cecil (Brother)   [phone] (Mobile)    Current Level of Care: Hospital Recommended Level of Care: Assisted Living Facility Prior Approval Number:    Date Approved/Denied:   PASRR Number:    Discharge Plan: Other (Comment) ([place])    Current Diagnoses: Patient Active Problem List   Diagnosis Date Noted   Scrotal pain    Acute renal failure superimposed on chronic kidney disease ([place]) 07/21/2021   GERD (gastroesophageal reflux disease) 03/09/2021   Hx of echocardiogram 03/09/2021   Goals of care, counseling/discussion 03/09/2021   CAD (coronary artery disease) 02/25/2021   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   Stage 3b chronic kidney disease (CKD) ([place]) 02/25/2021   Chest pain 02/23/2021   Closed fracture sternum 02/23/2021   Axillary lymphadenopathy 02/23/2021   HTN (hypertension) 02/23/2021   Acute renal failure superimposed on stage 4 chronic kidney disease ([place]) 02/23/2021   Hypertension 02/23/2021   HLD (hyperlipidemia) 02/23/2021   Depression 02/23/2021   BPH (benign prostatic hyperplasia) 02/23/2021   Positive D-dimer    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[place])    Constipation 08/22/2020   B12 deficiency 08/07/2020   CKD (chronic kidney disease) stage 4, GFR 15-29 ml/min ([place]) 08/07/2020   Closed nondisplaced fracture of medial cuneiform of right foot 08/07/2020   Compression fx, thoracic spine, closed, initial encounter ([place]) 08/07/2020   Altered mental status 07/31/2020    Orientation RESPIRATION  BLADDER Height & Weight     Self, Time, Place  Normal Continent Weight: 73 kg Height:  '5\' 5"'$  (165.1 cm)  BEHAVIORAL SYMPTOMS/MOOD NEUROLOGICAL BOWEL NUTRITION STATUS     (Forgetful) Continent Diet (Regular)  AMBULATORY STATUS COMMUNICATION OF NEEDS Skin   Limited Assist Verbally Skin abrasions (abrasion l leg)                       Personal Care Assistance Level of Assistance  Bathing, Feeding, Dressing Bathing Assistance: Limited assistance Feeding assistance: Limited assistance Dressing Assistance: Limited assistance     Functional Limitations Info  Sight, Hearing, Speech Sight Info: Adequate Hearing Info: Adequate Speech Info: Adequate    SPECIAL CARE FACTORS FREQUENCY                       Contractures Contractures Info: Not present    Additional Factors Info  Code Status, Allergies Code Status Info: FULL CODE Allergies Info: Brimonidine High  Itching   Timolol High  Itching   Iodinated Contrast Media           Current Medications (07/23/2021):  This is the current hospital active medication list Current Facility-Administered Medications  Medication Dose Route Frequency Provider Last Rate Last Admin   acetaminophen (TYLENOL) tablet 1,000 mg  1,000 mg Oral Q8H PRN Norins, Heinz Knuckles, MD   1,000 mg at 07/20/21 1526   albuterol (PROVENTIL) (2.5 MG/3ML) 0.083% nebulizer solution 3 mL  3 mL Inhalation Q6H PRN Norins, Heinz Knuckles, MD       calcium carbonate (TUMS - dosed in mg elemental calcium) chewable tablet 200  mg of elemental calcium  1 tablet Oral BID Norins, Heinz Knuckles, MD   200 mg of elemental calcium at 07/23/21 0932   dorzolamide-timolol (COSOPT) 22.3-6.8 MG/ML ophthalmic solution 1 drop  1 drop Both Eyes Q12H Norins, Heinz Knuckles, MD   1 drop at 07/23/21 0824   enoxaparin (LOVENOX) injection 30 mg  30 mg Subcutaneous Q24H Norins, Heinz Knuckles, MD   30 mg at 07/22/21 2045   FLUoxetine (PROZAC) capsule 20 mg  20 mg Oral Daily Norins, Heinz Knuckles, MD   20 mg at  07/23/21 0932   fluticasone (FLONASE) 50 MCG/ACT nasal spray 2 spray  2 spray Each Nare Daily Norins, Heinz Knuckles, MD   2 spray at 07/23/21 0931   gabapentin (NEURONTIN) capsule 200 mg  200 mg Oral QHS Norins, Heinz Knuckles, MD   200 mg at 07/22/21 2045   latanoprost (XALATAN) 0.005 % ophthalmic solution 1 drop  1 drop Both Eyes QHS Norins, Heinz Knuckles, MD   1 drop at 07/22/21 2046   lidocaine (LIDODERM) 5 % 1 patch  1 patch Transdermal Q24H Norins, Heinz Knuckles, MD   1 patch at 07/22/21 1620   loperamide (IMODIUM) capsule 2 mg  2 mg Oral BID PRN Norins, Heinz Knuckles, MD       loratadine (CLARITIN) tablet 10 mg  10 mg Oral Daily Norins, Heinz Knuckles, MD   10 mg at 07/23/21 0932   medroxyPROGESTERone (PROVERA) tablet 5 mg  5 mg Oral BH-qamhs Norins, Heinz Knuckles, MD   5 mg at 07/23/21 1610   melatonin tablet 5 mg  5 mg Oral QHS Norins, Heinz Knuckles, MD   5 mg at 07/22/21 2045   mometasone-formoterol (DULERA) 100-5 MCG/ACT inhaler 2 puff  2 puff Inhalation BID Neena Rhymes, MD   2 puff at 07/23/21 9604   nitroGLYCERIN (NITROSTAT) SL tablet 0.4 mg  0.4 mg Sublingual Q5 min PRN Norins, Heinz Knuckles, MD       oxyCODONE (Oxy IR/ROXICODONE) immediate release tablet 2.5 mg  2.5 mg Oral Q6H PRN Neena Rhymes, MD   2.5 mg at 07/23/21 5409   oxyCODONE-acetaminophen (PERCOCET/ROXICET) 5-325 MG per tablet 1 tablet  1 tablet Oral Q4H PRN Neena Rhymes, MD   1 tablet at 07/23/21 0435   pantoprazole (PROTONIX) EC tablet 40 mg  40 mg Oral Daily Norins, Heinz Knuckles, MD   40 mg at 07/23/21 8119   polyethylene glycol (MIRALAX / GLYCOLAX) packet 17 g  17 g Oral Daily Neena Rhymes, MD   17 g at 07/23/21 0931   psyllium (HYDROCIL/METAMUCIL) 1 packet  1 packet Oral Daily Noralee Space, RPH   1 packet at 07/22/21 1331   senna-docusate (Senokot-S) tablet 2 tablet  2 tablet Oral BID Neena Rhymes, MD   2 tablet at 07/23/21 0932   simvastatin (ZOCOR) tablet 20 mg  20 mg Oral Daily Norins, Heinz Knuckles, MD   20 mg at 07/22/21 2045    tamsulosin (FLOMAX) capsule 0.4 mg  0.4 mg Oral QPC supper Neena Rhymes, MD   0.4 mg at 07/22/21 1718   traZODone (DESYREL) tablet 100 mg  100 mg Oral QHS Neena Rhymes, MD   100 mg at 07/22/21 2045     Discharge Medications: Please see discharge summary for a list of discharge medications.  Relevant Imaging Results:  Relevant Lab Results:   Additional Information SSN [place] Bensyn Bornemann, RN

## 2021-07-23 NOTE — Progress Notes (Signed)
[facility]  ROUNDING NOTE   Subjective:   Patient seen sitting at side of bed Alert and oriented, hard of hearing Tolerating meals without nausea Patient seen later ambulating in the room with walker   Objective:  Vital signs in last 24 hours:  Temp:  [98.1 F (36.7 C)-99 F (37.2 C)] 99 F (37.2 C) (06/29 0927) Pulse Rate:  [49-58] 58 (06/29 0927) Resp:  [17-20] 17 (06/29 0927) BP: (117-125)/(55-76) 122/62 (06/29 0927) SpO2:  [94 %-98 %] 94 % (06/29 0927)  Weight change:  Filed Weights   07/20/21 1519  Weight: 73 kg    Intake/Output: I/O last 3 completed shifts: In: 2799.7 [I.V.:2799.7] Out: 2625 [Urine:2350; Stool:275]   Intake/Output this shift:  Total I/O In: -  Out: 400 [Urine:400]  Physical Exam: General: NAD, sitting at bedside   Head: Normocephalic, atraumatic. Moist oral mucosal membranes  Eyes: Anicteric  Lungs:  Clear to auscultation, normal effort  Heart: Regular rate and rhythm  Abdomen:  Soft, nontender  Extremities:  no peripheral edema.  Neurologic: Nonfocal, moving all four extremities  Skin: No lesions  Access: None    Basic Metabolic Panel: Recent Labs  Lab 07/20/21 0932 07/21/21 0449 07/22/21 0439 07/23/21 0755  NA 139 141 142 139  K 4.5 5.0 4.8 4.6  CL 110 112* 112* 109  CO2 '23 24 24 26  '$ GLUCOSE 83 87 97 97  BUN 34* 33* 35* 31*  CREATININE 2.81* 2.55* 2.67* 2.52*  CALCIUM 9.3 9.0 8.7* 8.8*  MG  --   --  2.0  --     Liver Function Tests: Recent Labs  Lab 07/20/21 0932  AST 13*  ALT 15  ALKPHOS 33*  BILITOT 1.1  PROT 6.5  ALBUMIN 3.9   Recent Labs  Lab 07/20/21 0932  LIPASE 38   No results for input(s): "AMMONIA" in the last 168 hours.  CBC: Recent Labs  Lab 07/20/21 0932  WBC 4.1  HGB 11.4*  HCT 36.0*  MCV 98.6  PLT 147*    Cardiac Enzymes: No results for input(s): "CKTOTAL", "CKMB", "CKMBINDEX", "TROPONINI" in the last 168 hours.  BNP: Invalid input(s): "POCBNP"  CBG: No  results for input(s): "GLUCAP" in the last 168 hours.  Microbiology: Results for orders placed or performed during the hospital encounter of 02/23/21  Resp Panel by RT-PCR (Flu A&B, Covid) Nasopharyngeal Swab     Status: None   Collection Time: 02/23/21  1:21 PM   Specimen: Nasopharyngeal Swab; Nasopharyngeal(NP) swabs in vial transport medium  Result Value Ref Range Status   SARS Coronavirus 2 by RT PCR NEGATIVE NEGATIVE Final    Comment: (NOTE) SARS-CoV-2 target nucleic acids are NOT DETECTED.  The SARS-CoV-2 RNA is generally detectable in upper respiratory specimens during the acute phase of infection. The lowest concentration of SARS-CoV-2 viral copies this assay can detect is 138 copies/mL. A negative result does not preclude SARS-Cov-2 infection and should not be used as the sole basis for treatment or other patient management decisions. A negative result may occur with  improper specimen collection/handling, submission of specimen other than nasopharyngeal swab, presence of viral mutation(s) within the areas targeted by this assay, and inadequate number of viral copies(<138 copies/mL). A negative result must be combined with clinical observations, patient history, and epidemiological information. The expected result is Negative.  Fact Sheet for Patients:  EntrepreneurPulse.com.au  Fact Sheet for Healthcare Providers:  IncredibleEmployment.be  This test is no t yet approved or cleared by the [place] FDA  and  has been authorized for detection and/or diagnosis of SARS-CoV-2 by FDA under an Emergency Use Authorization (EUA). This EUA will remain  in effect (meaning this test can be used) for the duration of the COVID-19 declaration under Section 564(b)(1) of the Act, 21 U.S.C.section 360bbb-3(b)(1), unless the authorization is terminated  or revoked sooner.       Influenza A by PCR NEGATIVE NEGATIVE Final   Influenza B by PCR  NEGATIVE NEGATIVE Final    Comment: (NOTE) The Xpert Xpress SARS-CoV-2/FLU/RSV plus assay is intended as an aid in the diagnosis of influenza from Nasopharyngeal swab specimens and should not be used as a sole basis for treatment. Nasal washings and aspirates are unacceptable for Xpert Xpress SARS-CoV-2/FLU/RSV testing.  Fact Sheet for Patients: EntrepreneurPulse.com.au  Fact Sheet for Healthcare Providers: IncredibleEmployment.be  This test is not yet approved or cleared by the [place] FDA and has been authorized for detection and/or diagnosis of SARS-CoV-2 by FDA under an Emergency Use Authorization (EUA). This EUA will remain in effect (meaning this test can be used) for the duration of the COVID-19 declaration under Section 564(b)(1) of the Act, 21 U.S.C. section 360bbb-3(b)(1), unless the authorization is terminated or revoked.  Performed at [facility], [address] [place], [place] [address]     Coagulation Studies: No results for input(s): "LABPROT", "INR" in the last 72 hours.  Urinalysis: No results for input(s): "COLORURINE", "LABSPEC", "PHURINE", "GLUCOSEU", "HGBUR", "BILIRUBINUR", "KETONESUR", "PROTEINUR", "UROBILINOGEN", "NITRITE", "LEUKOCYTESUR" in the last 72 hours.  Invalid input(s): "APPERANCEUR"    Imaging: No results found.   Medications:     calcium carbonate  1 tablet Oral BID   dorzolamide-timolol  1 drop Both Eyes Q12H   enoxaparin (LOVENOX) injection  30 mg Subcutaneous Q24H   FLUoxetine  20 mg Oral Daily   fluticasone  2 spray Each Nare Daily   gabapentin  200 mg Oral QHS   latanoprost  1 drop Both Eyes QHS   lidocaine  1 patch Transdermal Q24H   loratadine  10 mg Oral Daily   medroxyPROGESTERone  5 mg Oral BH-qamhs   melatonin  5 mg Oral QHS   mometasone-formoterol  2 puff Inhalation BID   pantoprazole  40 mg Oral Daily   polyethylene glycol  17 g Oral Daily   psyllium  1 packet  Oral Daily   senna-docusate  2 tablet Oral BID   simvastatin  20 mg Oral Daily   tamsulosin  0.4 mg Oral QPC supper   traZODone  100 mg Oral QHS   acetaminophen, albuterol, loperamide, nitroGLYCERIN, oxyCODONE, oxyCODONE-acetaminophen  Assessment/ Plan:  Mr. Kodie Kishi is a 81 y.o.  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  Acute kidney injury cause unknown at this time.  No offending agents noted in history.  Lisinopril currently held.  CT abdomen pelvis negative for obstruction.  No IV contrast exposure.  Patient denies NSAID use or recent illness.  UA with mild proteinuria.   Renal function has slightly improved. Still no identifiable cause for the kidney injury. Will arrange office  follow up with our office at discharge.   Lab Results  Component Value Date   CREATININE 2.52 (H) 07/23/2021   CREATININE 2.67 (H) 07/22/2021   CREATININE 2.55 (H) 07/21/2021    Intake/Output Summary (Last 24 hours) at 07/23/2021 1234 Last data filed at 07/23/2021 0829 Gross per 24 hour  Intake 2799.74 ml  Output 1425 ml  Net 1374.74 ml   2.  Hypertension with chronic kidney disease.  Home regimen includes lisinopril.  Currently held.  Blood pressure stable 122/62.    LOS: 2   6/29/202312:34 PM
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
## 2021-07-23 NOTE — TOC Progression Note (Signed)
Transition of Care [facility]) - Progression Note    Patient Details  Name: Jason Lin MRN: [id number] Date of Birth: 08-Nov-1940  Transition of Care [facility]) CM/SW [place], RN Phone Number: 07/23/2021, 10:00 AM  Clinical Narrative:   Patient has discharge summary, and reportedly has been given medications to control pain.  Patient and brother are amenable to transfer to [facility] today.  Patient will transfer by EMS.    Expected Discharge Plan: Assisted Living Barriers to Discharge: Continued Medical Work up  Expected Discharge Plan and Services Expected Discharge Plan: Assisted Living       Living arrangements for the past 2 months: [place] Expected Discharge Date: 07/23/21                                     Social Determinants of Health (SDOH) Interventions    Readmission Risk Interventions     No data to display

## 2021-07-23 NOTE — Discharge Summary (Signed)
Physician Discharge Summary   Patient: Jason Lin MRN: [id number] DOB: 1940/11/19  Admit date:     07/20/2021  Discharge date: 07/23/21  Discharge Physician: Sharen Hones   PCP: Pcp, No   Recommendations at discharge:   Follow-up with PCP in 1 week. Follow-up with nephrology in 1 to 2 weeks.  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 He was sent to [facility] for evaluation of left testicular pain, abdominal pain-diffuse and chest pain and question of worsening renal function.   Assessment and Plan: Acute renal failure on chronic kidney disease stage IV. Patient currently doing well, he received IV fluids, renal function did not improve to the baseline as of last year.  Discussed with nephrology, most likely this is due to progression renal disease.  Patient did not have any urinary retention on bladder scan.  He will be followed by nephrology as outpatient.  Currently he is medically stable to be discharged.   Pain in the scrotum with hydrocele. There is no scrotum edema.  Patient is evaluated by urology, hydrocele normally does not cause pain.  Suspect lower back pain with radiation.  Patient chronically on pain medicine, will continue for now.   Merkel cell cancer. Completed radiation therapy.  Essential hypertension Blood pressure stable.   Coronary artery disease. Dyslipidemia Stable.       Consultants:  Urology Procedures performed: None  Disposition: Assisted living Diet recommendation:  Discharge Diet Orders (From admission, onward)     Start     Ordered   07/22/21 0000  Diet - low sodium heart healthy        07/22/21 1055           Cardiac diet DISCHARGE MEDICATION: Allergies as of 07/23/2021       Reactions   Brimonidine Itching   Timolol Itching   Iodinated Contrast Media Other (See Comments)        Medication List     STOP taking these medications    escitalopram 10 MG tablet Commonly known as: LEXAPRO   lisinopril 5 MG tablet Commonly known as: ZESTRIL   predniSONE 20 MG tablet Commonly known as: DELTASONE       TAKE these medications    acetaminophen 500 MG tablet Commonly known as: TYLENOL Take 1,000 mg by mouth every 8 (eight) hours as needed.   Advair Diskus 100-50 MCG/ACT Aepb Generic drug: fluticasone-salmeterol Inhale 1 puff into the lungs 2 (two) times daily.   albuterol 108 (90 Base) MCG/ACT inhaler Commonly known as: VENTOLIN HFA Inhale 2 puffs into the lungs every 6 (six) hours as needed for wheezing or shortness of breath.   Biofreeze 4 % Gel Generic drug: Menthol (Topical Analgesic) Apply 1 Application topically in the morning, at noon, and at bedtime.   bisacodyl 10 MG suppository Commonly known as: DULCOLAX Place 10 mg rectally every three (3) days as needed for moderate constipation.   calcium carbonate 500 MG chewable tablet  Commonly known as: TUMS - dosed in mg elemental calcium Chew 1 tablet by mouth 2 (two) times daily.   dorzolamide-timolol 22.3-6.8 MG/ML ophthalmic solution Commonly known as: COSOPT Place 1 drop into both eyes every 12 (twelve) hours.   fexofenadine 60 MG tablet Commonly known as: ALLEGRA Take 60 mg by mouth daily as needed (itching).   FLUoxetine 20 MG capsule Commonly known as: PROZAC Take 20 mg by mouth daily.   fluticasone 50 MCG/ACT nasal spray Commonly known as: FLONASE Place 2  sprays into both nostrils daily.   gabapentin 100 MG capsule Commonly known as: NEURONTIN Take 200 mg by mouth at bedtime.   guaiFENesin 100 MG/5ML liquid Commonly known as: ROBITUSSIN Take 10 mLs by mouth every 4 (four) hours as needed for cough or to loosen phlegm.   latanoprost 0.005 % ophthalmic solution Commonly known as: XALATAN 1 drop at bedtime.   lidocaine 4 % cream Commonly known as: LMX Apply 1 Application topically daily as needed.   loperamide 2 MG tablet Commonly known as: IMODIUM A-D Take 1 mg by mouth 2 (two) times daily as needed for diarrhea or loose stools.   loratadine 10 MG tablet Commonly known as: CLARITIN Take 10 mg by mouth daily.   medroxyPROGESTERone 5 MG tablet Commonly known as: PROVERA Take 5 mg by mouth in the morning and at bedtime.   melatonin 3 MG Tabs tablet Take 6 mg by mouth at bedtime.   nitroGLYCERIN 0.4 MG SL tablet Commonly known as: NITROSTAT Place 0.4 mg under the tongue every 5 (five) minutes as needed for chest pain.   oxyCODONE 5 MG immediate release tablet Commonly known as: Oxy IR/ROXICODONE Take 2.5 mg by mouth every 6 (six) hours as needed for severe pain.   polyethylene glycol 17 g packet Commonly known as: MIRALAX / GLYCOLAX Take 17 g by mouth daily.   psyllium 0.52 g capsule Commonly known as: REGULOID Take 0.52 g by mouth daily.   senna-docusate 8.6-50 MG tablet Commonly known as: Senokot-S Take 2 tablets by mouth 2 (two) times daily.   simvastatin 20 MG tablet Commonly known as: ZOCOR Take 20 mg by mouth daily.   tamsulosin 0.4 MG Caps capsule Commonly known as: FLOMAX Take 0.4 mg by mouth daily after supper.   traZODone 100 MG tablet Commonly known as: DESYREL Take 100 mg by mouth at bedtime.        Follow-up Information     Lateef, Munsoor, MD Follow up in 1 week(s).   Specialty: Nephrology Contact information: [place] [address] [phone]          Abbie Sons, MD Follow up in 2 week(s).   Specialty: Urology Contact information: [place] [address] [phone]                Discharge Exam: Danley Danker Weights   07/20/21 1519  Weight: 73 kg   General exam: Appears calm and comfortable  Respiratory system: Clear to auscultation. Respiratory effort normal. Cardiovascular system: S1 & S2 heard, RRR. No JVD, murmurs, rubs, gallops or clicks. No pedal edema. Gastrointestinal system: Abdomen is nondistended, soft and nontender. No organomegaly or masses felt. Normal bowel sounds heard. Central nervous system: Alert and oriented. No focal neurological deficits. Extremities: Symmetric 5 x 5 power. Skin: No rashes, lesions or ulcers Psychiatry: Judgement and insight appear normal. Mood & affect appropriate.    Condition at discharge: fair  The results of significant  diagnostics from this hospitalization (including imaging, microbiology, ancillary and laboratory) are listed below for reference.   Imaging Studies: US SCROTUM W/DOPPLER  Result Date: 07/20/2021 CLINICAL DATA:  Left testicular pain.  History of lymphoma EXAM: SCROTAL ULTRASOUND DOPPLER ULTRASOUND OF THE TESTICLES TECHNIQUE: Complete ultrasound examination of the testicles, epididymis, and other scrotal structures was performed. Color and spectral Doppler ultrasound were also utilized to evaluate blood flow to the testicles. COMPARISON:  PET-CT 03/03/2021 FINDINGS: Right testicle Measurements: 4.0 x 2.3 x 3.1 cm. No mass or microlithiasis visualized. Left testicle Measurements: 4.1 x 1.9 x 3.3 cm. No mass or microlithiasis visualized. Right epididymis:  Normal in size and appearance. Left epididymis:  Normal in size and appearance. Hydrocele:  Small bilateral hydroceles. Varicocele:  None visualized. Pulsed Doppler interrogation of both testes demonstrates normal low resistance arterial and venous waveforms bilaterally. IMPRESSION: 1.  Negative for testicular torsion or intratesticular mass. 2. Small bilateral hydroceles. Electronically Signed   By: Davina Poke D.O.   On: 07/20/2021 12:58   CT Abdomen Pelvis Wo Contrast  Result Date: 07/20/2021 CLINICAL DATA:  Abdominal pain, acute, nonlocalized EXAM: CT ABDOMEN AND PELVIS WITHOUT CONTRAST TECHNIQUE: Multidetector CT imaging of the abdomen and pelvis was performed following the standard protocol without IV contrast. RADIATION DOSE REDUCTION: This exam was performed according to the departmental dose-optimization program which includes automated exposure control, adjustment of the mA and/or kV according to patient size and/or use of iterative reconstruction technique. COMPARISON:  None Available. FINDINGS: Lower chest: No acute abnormality. Hepatobiliary: No focal liver abnormality is seen. No gallstones, gallbladder wall thickening, or biliary dilatation. Pancreas: Unremarkable. Spleen: Unremarkable. Adrenals/Urinary Tract: Adrenals are unremarkable. Small cyst of the lower pole the right kidney. No calculi. Bladder is poorly distended but appears unremarkable. Stomach/Bowel: Stomach is within normal limits. Bowel is normal in caliber. Mild sigmoid diverticulosis. Vascular/Lymphatic: Atherosclerosis.  No enlarged nodes. Reproductive: Unremarkable. Other: No free fluid.  Abdominal wall is unremarkable. Musculoskeletal: Advanced degenerative changes of the included spine. IMPRESSION: No acute abnormality. Mild colonic sigmoid diverticulosis. Atherosclerosis. Electronically Signed   By: Macy Mis M.D.   On: 07/20/2021 11:44    Microbiology: Results for orders placed or performed during the hospital encounter of 02/23/21  Resp Panel by RT-PCR (Flu A&B, Covid) Nasopharyngeal Swab     Status: None   Collection Time: 02/23/21  1:21 PM   Specimen: Nasopharyngeal Swab; Nasopharyngeal(NP) swabs in vial transport medium  Result Value Ref Range Status   SARS Coronavirus 2 by RT PCR  NEGATIVE NEGATIVE Final    Comment: (NOTE) SARS-CoV-2 target nucleic acids are NOT DETECTED.  The SARS-CoV-2 RNA is generally detectable in upper respiratory specimens during the acute phase of infection. The lowest concentration of SARS-CoV-2 viral copies this assay can detect is 138 copies/mL. A negative result does not preclude SARS-Cov-2 infection and should not be used as the sole basis for treatment or other patient management decisions. A negative result may occur with  improper specimen collection/handling, submission of specimen other than nasopharyngeal swab, presence of viral mutation(s) within the areas targeted by this assay, and inadequate number of viral copies(<138 copies/mL). A negative result must be combined with clinical observations, patient history, and epidemiological information. The expected result is Negative.  Fact Sheet for Patients:  EntrepreneurPulse.com.au  Fact Sheet for Healthcare Providers:  IncredibleEmployment.be  This test is no t yet approved or cleared by the [place] FDA and  has been authorized for detection and/or diagnosis of SARS-CoV-2 by FDA under an  Emergency Use Authorization (EUA). This EUA will remain  in effect (meaning this test can be used) for the duration of the COVID-19 declaration under Section 564(b)(1) of the Act, 21 U.S.C.section 360bbb-3(b)(1), unless the authorization is terminated  or revoked sooner.       Influenza A by PCR NEGATIVE NEGATIVE Final   Influenza B by PCR NEGATIVE NEGATIVE Final    Comment: (NOTE) The Xpert Xpress SARS-CoV-2/FLU/RSV plus assay is intended as an aid in the diagnosis of influenza from Nasopharyngeal swab specimens and should not be used as a sole basis for treatment. Nasal washings and aspirates are unacceptable for Xpert Xpress SARS-CoV-2/FLU/RSV testing.  Fact Sheet for Patients: EntrepreneurPulse.com.au  Fact Sheet for  Healthcare Providers: IncredibleEmployment.be  This test is not yet approved or cleared by the [place] FDA and has been authorized for detection and/or diagnosis of SARS-CoV-2 by FDA under an Emergency Use Authorization (EUA). This EUA will remain in effect (meaning this test can be used) for the duration of the COVID-19 declaration under Section 564(b)(1) of the Act, 21 U.S.C. section 360bbb-3(b)(1), unless the authorization is terminated or revoked.  Performed at [facility], [address] [place], [place] [address]     Labs: CBC: Recent Labs  Lab 07/20/21 0932  WBC 4.1  HGB 11.4*  HCT 36.0*  MCV 98.6  PLT 115*   Basic Metabolic Panel: Recent Labs  Lab 07/20/21 0932 07/21/21 0449 07/22/21 0439 07/23/21 0755  NA 139 141 142 139  K 4.5 5.0 4.8 4.6  CL 110 112* 112* 109  CO2 '23 24 24 26  '$ GLUCOSE 83 87 97 97  BUN 34* 33* 35* 31*  CREATININE 2.81* 2.55* 2.67* 2.52*  CALCIUM 9.3 9.0 8.7* 8.8*  MG  --   --  2.0  --    Liver Function Tests: Recent Labs  Lab 07/20/21 0932  AST 13*  ALT 15  ALKPHOS 33*  BILITOT 1.1  PROT 6.5  ALBUMIN 3.9   CBG: No results for input(s): "GLUCAP" in the last 168 hours.  Discharge time spent: less than 30 minutes.  Signed: Sharen Hones, MD [facility] 07/23/2021

## 2021-07-24 NOTE — TOC Progression Note (Signed)
Transition of Care [facility]) - Progression Note    Patient Details  Name: [name] MRN: [id number] Date of Birth: 02/24/1940  [facility]) CM/SW [place], RN Phone Number: 07/24/2021, 10:50 AM  Clinical Narrative:   Revision to discharge information:  facility transported patient to [facility] via their facility [facility].  EMS was not able to provide transport    Expected Discharge Plan: Assisted Living Barriers to Discharge: Continued Medical Work up  Expected Discharge Plan and Services Expected Discharge Plan: Assisted Living       Living arrangements for the past 2 months: [facility] Expected Discharge Date: 07/23/21                                     Social Determinants of Health (SDOH) Interventions    Readmission Risk Interventions     No data to display

## 2021-08-06 ENCOUNTER — Encounter: Payer: Self-pay | Admitting: Intensive Care

## 2021-08-06 ENCOUNTER — Other Ambulatory Visit: Payer: Self-pay

## 2021-08-06 ENCOUNTER — Emergency Department
Admission: EM | Admit: 2021-08-06 | Discharge: 2021-08-07 | Disposition: A | Discharge status: Skilled Nursing Facility | Payer: Medicare PPO | Attending: Emergency Medicine | Admitting: Emergency Medicine

## 2021-08-06 ENCOUNTER — Emergency Department: Payer: Medicare PPO

## 2021-08-06 DIAGNOSIS — I2510 Atherosclerotic heart disease of native coronary artery without angina pectoris: Secondary | ICD-10-CM | POA: Diagnosis not present

## 2021-08-06 DIAGNOSIS — Z85821 Personal history of Merkel cell carcinoma: Secondary | ICD-10-CM | POA: Insufficient documentation

## 2021-08-06 DIAGNOSIS — I129 Hypertensive chronic kidney disease with stage 1 through stage 4 chronic kidney disease, or unspecified chronic kidney disease: Secondary | ICD-10-CM | POA: Diagnosis not present

## 2021-08-06 DIAGNOSIS — F0390 Unspecified dementia without behavioral disturbance: Secondary | ICD-10-CM

## 2021-08-06 DIAGNOSIS — N189 Chronic kidney disease, unspecified: Secondary | ICD-10-CM | POA: Diagnosis not present

## 2021-08-06 DIAGNOSIS — R0789 Other chest pain: Secondary | ICD-10-CM

## 2021-08-06 DIAGNOSIS — R944 Abnormal results of kidney function studies: Secondary | ICD-10-CM | POA: Diagnosis not present

## 2021-08-06 LAB — CBC
HCT: 36.1 % — ABNORMAL LOW (ref 39.0–52.0)
Hemoglobin: 11.4 g/dL — ABNORMAL LOW (ref 13.0–17.0)
MCH: 31.3 pg (ref 26.0–34.0)
MCHC: 31.6 g/dL (ref 30.0–36.0)
MCV: 99.2 fL (ref 80.0–100.0)
Platelets: 179 10*3/uL (ref 150–400)
RBC: 3.64 MIL/uL — ABNORMAL LOW (ref 4.22–5.81)
RDW: 12.7 % (ref 11.5–15.5)
WBC: 5.8 10*3/uL (ref 4.0–10.5)
nRBC: 0 % (ref 0.0–0.2)

## 2021-08-06 LAB — BASIC METABOLIC PANEL
Anion gap: 5 (ref 5–15)
BUN: 32 mg/dL — ABNORMAL HIGH (ref 8–23)
CO2: 22 mmol/L (ref 22–32)
Calcium: 9 mg/dL (ref 8.9–10.3)
Chloride: 111 mmol/L (ref 98–111)
Creatinine, Ser: 2.67 mg/dL — ABNORMAL HIGH (ref 0.61–1.24)
GFR, Estimated: 23 mL/min — ABNORMAL LOW (ref >60)
Glucose, Bld: 108 mg/dL — ABNORMAL HIGH (ref 70–99)
Potassium: 4 mmol/L (ref 3.5–5.1)
Sodium: 138 mmol/L (ref 135–145)

## 2021-08-06 LAB — TROPONIN I (HIGH SENSITIVITY)
Troponin I (High Sensitivity): 5 ng/L (ref <18)
Troponin I (High Sensitivity): 6 ng/L (ref <18)

## 2021-08-06 NOTE — ED Provider Notes (Signed)
[facility] Provider Note    Event Date/Time   First MD Initiated Contact with Patient 08/06/21 2357     (approximate)   History   Chief Complaint Chest Pain   HPI  Jason Lin is a 81 y.o. male with past medical history of hypertension, hyperlipidemia, CAD, CKD, dementia, and Merkel cell carcinoma who presents to the ED complaining of chest pain.  Patient reports that he had sudden onset of sharp pain in the center of his chest since around dinnertime.  Pain has been constant since then, has not been exacerbated or alleviated by anything in particular.  Patient denies any associated fevers, cough, shortness of breath, leg swelling or pain.  He has not taken anything for his symptoms, does state that he is overdue for his usual oxycodone that he takes for pain.     Physical Exam   Triage Vital Signs: ED Triage Vitals  Enc Vitals Group     BP 08/06/21 1413 118/76     Pulse Rate 08/06/21 1413 (!) 56     Resp 08/06/21 1413 16     Temp 08/06/21 1413 98.6 F (37 C)     Temp Source 08/06/21 1413 Oral     SpO2 08/06/21 1413 98 %     Weight 08/06/21 1414 163 lb (73.9 kg)     Height 08/06/21 1414 '5\' 5"'$  (1.651 m)     Head Circumference --      Peak Flow --      Pain Score 08/06/21 1414 10     Pain Loc --      Pain Edu? --      Excl. in [place]? --     Most recent vital signs: Vitals:   08/06/21 2030 08/07/21 0020  BP: (!) 162/78 (!) 163/79  Pulse: (!) 56 (!) 53  Resp: 18 18  Temp:    SpO2: 99% 98%    Constitutional: Alert and oriented. Eyes: Conjunctivae are normal. Head: Atraumatic. Nose: No congestion/rhinnorhea. Mouth/Throat: Mucous membranes are moist.  Cardiovascular: Normal rate, regular rhythm. Grossly normal heart sounds.  2+ radial pulses bilaterally. Respiratory: Normal respiratory effort.  No retractions. Lungs CTAB.  Central chest wall tenderness to palpation noted. Gastrointestinal: Soft and nontender. No  distention. Musculoskeletal: No lower extremity tenderness nor edema.  Neurologic:  Normal speech and language. No gross focal neurologic deficits are appreciated.    ED Results / Procedures / Treatments   Labs (all labs ordered are listed, but only abnormal results are displayed) Labs Reviewed  BASIC METABOLIC PANEL - Abnormal; Notable for the following components:      Result Value   Glucose, Bld 108 (*)    BUN 32 (*)    Creatinine, Ser 2.67 (*)    GFR, Estimated 23 (*)    All other components within normal limits  CBC - Abnormal; Notable for the following components:   RBC 3.64 (*)    Hemoglobin 11.4 (*)    HCT 36.1 (*)    All other components within normal limits  TROPONIN I (HIGH SENSITIVITY)  TROPONIN I (HIGH SENSITIVITY)     EKG  ED ECG REPORT I, Blake Divine, the attending physician, personally viewed and interpreted this ECG.   Date: 08/06/2021  EKG Time: 14:22  Rate: 59  Rhythm: sinus bradycardia  Axis: Normal  Intervals:none  ST&T Change: None  RADIOLOGY Chest x-ray reviewed and interpreted by me with no infiltrate, edema, or effusion.  PROCEDURES:  Critical Care performed: No  Procedures   MEDICATIONS ORDERED IN ED: Medications  oxyCODONE (Oxy IR/ROXICODONE) immediate release tablet 5 mg (has no administration in time range)  lidocaine (LIDODERM) 5 % 1 patch (has no administration in time range)     IMPRESSION / MDM / ASSESSMENT AND PLAN / ED COURSE  I reviewed the triage vital signs and the nursing notes.                              81 y.o. male with past medical history of hypertension, hyperlipidemia, CAD, CKD, Merkel cell carcinoma, and dementia who presents to the ED complaining of sharp pain in the center of his chest since dinnertime earlier this evening.  Patient's presentation is most consistent with acute presentation with potential threat to life or bodily function.  Differential diagnosis includes, but is not limited to,  ACS, PE, pneumonia, pneumothorax, dissection, GERD, musculoskeletal chest pain.  Patient well-appearing and in no acute distress, vital signs are unremarkable and EKG shows no evidence of arrhythmia or ischemia.  He is slightly bradycardic but with stable blood pressure and does not take any AV nodal blocking agents.  Labs are reassuring with 2 sets of troponin within normal limits, doubt cardiac etiology of her symptoms given atypical chest pain that is reproducible on palpation.  Chest x-ray is also unremarkable, remainder of labs are reassuring with no significant anemia, leukocytosis, or electrolyte abnormality.  He does have elevated creatinine chronically that appears unchanged from recent admission, when he was admitted for AKI, evaluated by nephrology and determined this level appears to be his new baseline.  Low suspicion for PE given reassuring vital signs with reproducible pain.  We will give his usual oxycodone as well as Lidoderm patch for pain, patient is appropriate for discharge home with oncology and cardiology follow-up.  He was counseled to return to the ED for new or worsening symptoms, patient agrees with plan.      FINAL CLINICAL IMPRESSION(S) / ED DIAGNOSES   Final diagnoses:  Atypical chest pain  Dementia without behavioral disturbance, psychotic disturbance, mood disturbance, or anxiety, unspecified dementia severity, unspecified dementia type ([place])  Chronic kidney disease, unspecified CKD stage     Rx / DC Orders   ED Discharge Orders          Ordered    Ambulatory referral to Cardiology        08/07/21 0009             Note:  This document was prepared using Dragon voice recognition software and may include unintentional dictation errors.   Blake Divine, MD 08/07/21 0020

## 2021-08-06 NOTE — ED Notes (Signed)
Pt to ED via [facility] from [facility] for sudden onset chest pain when he was going to lunch about 1 hour ago. Per EMS the pain does not radiate and is worse with a deep breath. Pt does have cardiac history. Pt is in NAD  HR: 56 BP: 134/65 SpO2: 98%

## 2021-08-07 DIAGNOSIS — R0789 Other chest pain: Secondary | ICD-10-CM | POA: Diagnosis not present

## 2021-08-07 MED ORDER — LIDOCAINE 5 % EX PTCH
1.0000 | MEDICATED_PATCH | CUTANEOUS | Status: DC
  Administered 2021-08-07: 1 via TRANSDERMAL
  Filled 2021-08-07: qty 1

## 2021-08-07 MED ORDER — OXYCODONE HCL 5 MG PO TABS
5.0000 mg | ORAL_TABLET | Freq: Once | ORAL | Status: AC
  Administered 2021-08-07: 5 mg via ORAL
  Filled 2021-08-07: qty 1

## 2021-08-10 ENCOUNTER — Ambulatory Visit: Payer: Medicare PPO

## 2021-08-10 ENCOUNTER — Telehealth: Payer: Self-pay | Admitting: Oncology

## 2021-08-10 NOTE — Telephone Encounter (Signed)
Spoke with Lars Mage at [facility] to provide her with updated PET scan appointment information. Patient was given breakfast this morning and the scan had to be moved. Reminded Lars Mage that patient can have only small sips of water after midnight the evening prior to scan.

## 2021-08-12 ENCOUNTER — Inpatient Hospital Stay: Payer: Medicare PPO

## 2021-08-12 ENCOUNTER — Inpatient Hospital Stay: Payer: Medicare PPO | Admitting: Oncology

## 2021-08-17 ENCOUNTER — Ambulatory Visit: Payer: Medicare PPO | Admitting: Radiation Oncology

## 2021-08-18 ENCOUNTER — Ambulatory Visit
Admission: RE | Admit: 2021-08-18 | Discharge: 2021-08-18 | Disposition: A | Discharge status: Home / Self Care | Payer: Medicare PPO | Source: Ambulatory Visit | Attending: Oncology | Admitting: Oncology

## 2021-08-18 DIAGNOSIS — N400 Enlarged prostate without lower urinary tract symptoms: Secondary | ICD-10-CM | POA: Diagnosis not present

## 2021-08-18 DIAGNOSIS — C4A9 Merkel cell carcinoma, unspecified: Secondary | ICD-10-CM | POA: Insufficient documentation

## 2021-08-18 DIAGNOSIS — M159 Polyosteoarthritis, unspecified: Secondary | ICD-10-CM | POA: Diagnosis not present

## 2021-08-18 DIAGNOSIS — K5730 Diverticulosis of large intestine without perforation or abscess without bleeding: Secondary | ICD-10-CM | POA: Insufficient documentation

## 2021-08-18 DIAGNOSIS — I700 Atherosclerosis of aorta: Secondary | ICD-10-CM | POA: Diagnosis not present

## 2021-08-18 LAB — GLUCOSE, CAPILLARY: Glucose-Capillary: 106 mg/dL — ABNORMAL HIGH (ref 70–99)

## 2021-08-18 MED ORDER — FLUDEOXYGLUCOSE F - 18 (FDG) INJECTION
8.4000 | Freq: Once | INTRAVENOUS | Status: AC | PRN
Start: 2021-08-18 — End: 2021-08-18
  Administered 2021-08-18: 8.28 via INTRAVENOUS

## 2021-08-25 ENCOUNTER — Telehealth: Payer: Self-pay | Admitting: *Deleted

## 2021-08-25 NOTE — Telephone Encounter (Signed)
Please check if pt would be ok with doing virtual appt or if our transportation can bring him.

## 2021-08-25 NOTE — Telephone Encounter (Signed)
Patient brother Laveda Abbe called asking if we have found out what is wrong with patient. He also states that the facility that he is in said they will no longer transport the patient to our office for appts. I see that he is scheduled for 8/7 for lab doctor. Laveda Abbe said he cannot transport him because he is home with a broken hip himself. Please return call to [facility]

## 2021-08-26 ENCOUNTER — Telehealth: Payer: Self-pay | Admitting: Oncology

## 2021-08-26 NOTE — Telephone Encounter (Addendum)
Per Dr. Tasia Catchings, PET scan showed treatment response and no additional treatment is needed right away. Pt's brother Jason Lin has been informed of this.   Please cancel appts on 8/7 (lab/MD) with Dr. Tasia Catchings & schedule MD only on 10/26/21 @ 2pm. Jason Lin is aware of appt changes.

## 2021-08-26 NOTE — Telephone Encounter (Signed)
Please advise. Seems like transportation is an issue, virutal is not an option and brother (POA) has questions. Brother is unable to come to appt with Jason Lin due to broken him.

## 2021-08-26 NOTE — Telephone Encounter (Signed)
With his insurance have the brother call [phone] and see if they are able to transport pt

## 2021-08-31 ENCOUNTER — Inpatient Hospital Stay (HOSPITAL_BASED_OUTPATIENT_CLINIC_OR_DEPARTMENT_OTHER): Payer: Medicare PPO | Admitting: Oncology

## 2021-08-31 ENCOUNTER — Inpatient Hospital Stay: Payer: Medicare Other

## 2021-08-31 ENCOUNTER — Inpatient Hospital Stay: Payer: Medicare PPO | Attending: Oncology

## 2021-08-31 ENCOUNTER — Inpatient Hospital Stay: Payer: Medicare Other | Admitting: Oncology

## 2021-08-31 ENCOUNTER — Emergency Department: Payer: Medicare PPO

## 2021-08-31 ENCOUNTER — Observation Stay
Admission: EM | Admit: 2021-08-31 | Discharge: 2021-09-03 | Disposition: A | Discharge status: Home / Self Care | Payer: Medicare PPO | Attending: Internal Medicine | Admitting: Internal Medicine

## 2021-08-31 ENCOUNTER — Encounter: Payer: Self-pay | Admitting: Emergency Medicine

## 2021-08-31 ENCOUNTER — Ambulatory Visit: Payer: Medicare Other | Admitting: Radiation Oncology

## 2021-08-31 ENCOUNTER — Telehealth: Payer: Self-pay

## 2021-08-31 ENCOUNTER — Encounter: Payer: Self-pay | Admitting: Oncology

## 2021-08-31 ENCOUNTER — Other Ambulatory Visit: Payer: Self-pay

## 2021-08-31 VITALS — BP 152/80 | HR 50 | Temp 99.0°F | Resp 17 | Wt 161.0 lb

## 2021-08-31 DIAGNOSIS — Z8249 Family history of ischemic heart disease and other diseases of the circulatory system: Secondary | ICD-10-CM | POA: Diagnosis not present

## 2021-08-31 DIAGNOSIS — Z85821 Personal history of Merkel cell carcinoma: Secondary | ICD-10-CM | POA: Diagnosis not present

## 2021-08-31 DIAGNOSIS — E785 Hyperlipidemia, unspecified: Secondary | ICD-10-CM | POA: Diagnosis present

## 2021-08-31 DIAGNOSIS — N1832 Chronic kidney disease, stage 3b: Secondary | ICD-10-CM | POA: Diagnosis not present

## 2021-08-31 DIAGNOSIS — C773 Secondary and unspecified malignant neoplasm of axilla and upper limb lymph nodes: Secondary | ICD-10-CM | POA: Insufficient documentation

## 2021-08-31 DIAGNOSIS — I700 Atherosclerosis of aorta: Secondary | ICD-10-CM | POA: Diagnosis not present

## 2021-08-31 DIAGNOSIS — N400 Enlarged prostate without lower urinary tract symptoms: Secondary | ICD-10-CM | POA: Diagnosis not present

## 2021-08-31 DIAGNOSIS — R0602 Shortness of breath: Secondary | ICD-10-CM | POA: Insufficient documentation

## 2021-08-31 DIAGNOSIS — C4A9 Merkel cell carcinoma, unspecified: Secondary | ICD-10-CM

## 2021-08-31 DIAGNOSIS — Z85828 Personal history of other malignant neoplasm of skin: Secondary | ICD-10-CM | POA: Diagnosis not present

## 2021-08-31 DIAGNOSIS — I129 Hypertensive chronic kidney disease with stage 1 through stage 4 chronic kidney disease, or unspecified chronic kidney disease: Secondary | ICD-10-CM | POA: Diagnosis not present

## 2021-08-31 DIAGNOSIS — R0789 Other chest pain: Secondary | ICD-10-CM

## 2021-08-31 DIAGNOSIS — R001 Bradycardia, unspecified: Secondary | ICD-10-CM | POA: Diagnosis not present

## 2021-08-31 DIAGNOSIS — R590 Localized enlarged lymph nodes: Secondary | ICD-10-CM | POA: Diagnosis present

## 2021-08-31 DIAGNOSIS — Z818 Family history of other mental and behavioral disorders: Secondary | ICD-10-CM | POA: Diagnosis not present

## 2021-08-31 DIAGNOSIS — F0390 Unspecified dementia without behavioral disturbance: Secondary | ICD-10-CM | POA: Insufficient documentation

## 2021-08-31 DIAGNOSIS — R079 Chest pain, unspecified: Secondary | ICD-10-CM | POA: Diagnosis not present

## 2021-08-31 DIAGNOSIS — I10 Essential (primary) hypertension: Secondary | ICD-10-CM | POA: Diagnosis not present

## 2021-08-31 DIAGNOSIS — R296 Repeated falls: Secondary | ICD-10-CM | POA: Insufficient documentation

## 2021-08-31 DIAGNOSIS — F0393 Unspecified dementia, unspecified severity, with mood disturbance: Secondary | ICD-10-CM | POA: Diagnosis not present

## 2021-08-31 DIAGNOSIS — I2510 Atherosclerotic heart disease of native coronary artery without angina pectoris: Secondary | ICD-10-CM | POA: Diagnosis present

## 2021-08-31 DIAGNOSIS — Z79899 Other long term (current) drug therapy: Secondary | ICD-10-CM | POA: Diagnosis not present

## 2021-08-31 DIAGNOSIS — F32A Depression, unspecified: Secondary | ICD-10-CM | POA: Diagnosis present

## 2021-08-31 DIAGNOSIS — K219 Gastro-esophageal reflux disease without esophagitis: Secondary | ICD-10-CM | POA: Diagnosis present

## 2021-08-31 DIAGNOSIS — R2689 Other abnormalities of gait and mobility: Secondary | ICD-10-CM | POA: Diagnosis not present

## 2021-08-31 LAB — COMPREHENSIVE METABOLIC PANEL
ALT: 12 U/L (ref 0–44)
ALT: 11 U/L (ref 0–44)
AST: 11 U/L — ABNORMAL LOW (ref 15–41)
AST: 14 U/L — ABNORMAL LOW (ref 15–41)
Albumin: 3.6 g/dL (ref 3.5–5.0)
Albumin: 3.6 g/dL (ref 3.5–5.0)
Alkaline Phosphatase: 32 U/L — ABNORMAL LOW (ref 38–126)
Alkaline Phosphatase: 33 U/L — ABNORMAL LOW (ref 38–126)
Anion gap: 5 (ref 5–15)
Anion gap: 6 (ref 5–15)
BUN: 29 mg/dL — ABNORMAL HIGH (ref 8–23)
BUN: 30 mg/dL — ABNORMAL HIGH (ref 8–23)
CO2: 26 mmol/L (ref 22–32)
CO2: 20 mmol/L — ABNORMAL LOW (ref 22–32)
Calcium: 8.9 mg/dL (ref 8.9–10.3)
Calcium: 8.8 mg/dL — ABNORMAL LOW (ref 8.9–10.3)
Chloride: 107 mmol/L (ref 98–111)
Chloride: 111 mmol/L (ref 98–111)
Creatinine, Ser: 2.61 mg/dL — ABNORMAL HIGH (ref 0.61–1.24)
Creatinine, Ser: 2.52 mg/dL — ABNORMAL HIGH (ref 0.61–1.24)
GFR, Estimated: 24 mL/min — ABNORMAL LOW (ref >60)
GFR, Estimated: 25 mL/min — ABNORMAL LOW (ref >60)
Glucose, Bld: 103 mg/dL — ABNORMAL HIGH (ref 70–99)
Glucose, Bld: 92 mg/dL (ref 70–99)
Potassium: 4.3 mmol/L (ref 3.5–5.1)
Potassium: 4.6 mmol/L (ref 3.5–5.1)
Sodium: 138 mmol/L (ref 135–145)
Sodium: 137 mmol/L (ref 135–145)
Total Bilirubin: 1.1 mg/dL (ref 0.3–1.2)
Total Bilirubin: 1.2 mg/dL (ref 0.3–1.2)
Total Protein: 6.3 g/dL — ABNORMAL LOW (ref 6.5–8.1)
Total Protein: 6.2 g/dL — ABNORMAL LOW (ref 6.5–8.1)

## 2021-08-31 LAB — CBC WITH DIFFERENTIAL/PLATELET
Abs Immature Granulocytes: 0.04 10*3/uL (ref 0.00–0.07)
Abs Immature Granulocytes: 0.05 10*3/uL (ref 0.00–0.07)
Basophils Absolute: 0 10*3/uL (ref 0.0–0.1)
Basophils Absolute: 0 10*3/uL (ref 0.0–0.1)
Basophils Relative: 1 %
Basophils Relative: 1 %
Eosinophils Absolute: 0.2 10*3/uL (ref 0.0–0.5)
Eosinophils Absolute: 0.3 10*3/uL (ref 0.0–0.5)
Eosinophils Relative: 5 %
Eosinophils Relative: 5 %
HCT: 36.8 % — ABNORMAL LOW (ref 39.0–52.0)
HCT: 37.6 % — ABNORMAL LOW (ref 39.0–52.0)
Hemoglobin: 11.7 g/dL — ABNORMAL LOW (ref 13.0–17.0)
Hemoglobin: 11.9 g/dL — ABNORMAL LOW (ref 13.0–17.0)
Immature Granulocytes: 1 %
Immature Granulocytes: 1 %
Lymphocytes Relative: 12 %
Lymphocytes Relative: 13 %
Lymphs Abs: 0.6 10*3/uL — ABNORMAL LOW (ref 0.7–4.0)
Lymphs Abs: 0.8 10*3/uL (ref 0.7–4.0)
MCH: 31.7 pg (ref 26.0–34.0)
MCH: 31.7 pg (ref 26.0–34.0)
MCHC: 31.8 g/dL (ref 30.0–36.0)
MCHC: 31.6 g/dL (ref 30.0–36.0)
MCV: 99.7 fL (ref 80.0–100.0)
MCV: 100.3 fL — ABNORMAL HIGH (ref 80.0–100.0)
Monocytes Absolute: 0.4 10*3/uL (ref 0.1–1.0)
Monocytes Absolute: 0.6 10*3/uL (ref 0.1–1.0)
Monocytes Relative: 8 %
Monocytes Relative: 9 %
Neutro Abs: 3.6 10*3/uL (ref 1.7–7.7)
Neutro Abs: 4.1 10*3/uL (ref 1.7–7.7)
Neutrophils Relative %: 73 %
Neutrophils Relative %: 71 %
Platelets: 150 10*3/uL (ref 150–400)
Platelets: 151 10*3/uL (ref 150–400)
RBC: 3.69 MIL/uL — ABNORMAL LOW (ref 4.22–5.81)
RBC: 3.75 MIL/uL — ABNORMAL LOW (ref 4.22–5.81)
RDW: 12.5 % (ref 11.5–15.5)
RDW: 12.6 % (ref 11.5–15.5)
WBC: 4.9 10*3/uL (ref 4.0–10.5)
WBC: 5.8 10*3/uL (ref 4.0–10.5)
nRBC: 0 % (ref 0.0–0.2)
nRBC: 0 % (ref 0.0–0.2)

## 2021-08-31 LAB — TROPONIN I (HIGH SENSITIVITY)
Troponin I (High Sensitivity): 5 ng/L (ref <18)
Troponin I (High Sensitivity): 5 ng/L (ref <18)

## 2021-08-31 LAB — TSH: TSH: 3.72 u[IU]/mL (ref 0.350–4.500)

## 2021-08-31 LAB — D-DIMER, QUANTITATIVE: D-Dimer, Quant: 0.75 ug/mL-FEU — ABNORMAL HIGH (ref 0.00–0.50)

## 2021-08-31 MED ORDER — ALBUTEROL SULFATE (2.5 MG/3ML) 0.083% IN NEBU
2.5000 mg | INHALATION_SOLUTION | Freq: Four times a day (QID) | RESPIRATORY_TRACT | Status: DC | PRN
  Administered 2021-08-31: 2.5 mg via RESPIRATORY_TRACT
  Filled 2021-08-31: qty 3

## 2021-08-31 MED ORDER — SENNOSIDES-DOCUSATE SODIUM 8.6-50 MG PO TABS
2.0000 | ORAL_TABLET | Freq: Two times a day (BID) | ORAL | Status: DC
  Administered 2021-08-31 – 2021-09-02 (×4): 2 via ORAL
  Filled 2021-08-31 (×4): qty 2

## 2021-08-31 MED ORDER — TRAZODONE HCL 100 MG PO TABS
100.0000 mg | ORAL_TABLET | Freq: Every day | ORAL | Status: DC
  Administered 2021-08-31 – 2021-09-02 (×3): 100 mg via ORAL
  Filled 2021-08-31 (×3): qty 1

## 2021-08-31 MED ORDER — ACETAMINOPHEN 325 MG PO TABS
650.0000 mg | ORAL_TABLET | Freq: Three times a day (TID) | ORAL | Status: DC | PRN
  Administered 2021-09-02: 650 mg via ORAL
  Filled 2021-08-31: qty 2

## 2021-08-31 MED ORDER — DORZOLAMIDE HCL-TIMOLOL MAL 2-0.5 % OP SOLN
1.0000 [drp] | Freq: Two times a day (BID) | OPHTHALMIC | Status: DC
Start: 2021-08-31 — End: 2021-09-03
  Administered 2021-08-31 – 2021-09-03 (×6): 1 [drp] via OPHTHALMIC
  Filled 2021-08-31 (×2): qty 10

## 2021-08-31 MED ORDER — MOMETASONE FURO-FORMOTEROL FUM 100-5 MCG/ACT IN AERO
2.0000 | INHALATION_SPRAY | Freq: Two times a day (BID) | RESPIRATORY_TRACT | Status: DC
  Administered 2021-08-31 – 2021-09-03 (×6): 2 via RESPIRATORY_TRACT
  Filled 2021-08-31: qty 8.8

## 2021-08-31 MED ORDER — ONDANSETRON HCL 4 MG/2ML IJ SOLN: 4.0000 mg | Freq: Four times a day (QID) | INTRAMUSCULAR | Status: DC | PRN

## 2021-08-31 MED ORDER — OXYCODONE HCL 5 MG PO TABS
5.0000 mg | ORAL_TABLET | ORAL | Status: AC | PRN
  Administered 2021-08-31 – 2021-09-01 (×3): 5 mg via ORAL
  Filled 2021-08-31 (×3): qty 1

## 2021-08-31 MED ORDER — LATANOPROST 0.005 % OP SOLN
1.0000 [drp] | Freq: Every day | OPHTHALMIC | Status: DC
  Administered 2021-08-31 – 2021-09-02 (×3): 1 [drp] via OPHTHALMIC
  Filled 2021-08-31: qty 2.5

## 2021-08-31 MED ORDER — ASPIRIN 81 MG PO CHEW
81.0000 mg | CHEWABLE_TABLET | Freq: Every day | ORAL | Status: DC
  Administered 2021-08-31 – 2021-09-03 (×4): 81 mg via ORAL
  Filled 2021-08-31 (×4): qty 1

## 2021-08-31 MED ORDER — SIMVASTATIN 20 MG PO TABS
20.0000 mg | ORAL_TABLET | Freq: Every day | ORAL | Status: DC
  Administered 2021-08-31 – 2021-09-03 (×4): 20 mg via ORAL
  Filled 2021-08-31: qty 1
  Filled 2021-08-31: qty 2
  Filled 2021-08-31: qty 1
  Filled 2021-08-31: qty 2

## 2021-08-31 MED ORDER — HYDRALAZINE HCL 20 MG/ML IJ SOLN: 5.0000 mg | Freq: Four times a day (QID) | INTRAMUSCULAR | Status: DC | PRN

## 2021-08-31 MED ORDER — ONDANSETRON HCL 4 MG PO TABS: 4.0000 mg | ORAL_TABLET | Freq: Four times a day (QID) | ORAL | Status: DC | PRN

## 2021-08-31 MED ORDER — SODIUM CHLORIDE 0.9 % IV SOLN: INTRAVENOUS | Status: DC

## 2021-08-31 MED ORDER — FLUTICASONE PROPIONATE 50 MCG/ACT NA SUSP
2.0000 | Freq: Every day | NASAL | Status: DC
  Administered 2021-09-01 – 2021-09-03 (×2): 2 via NASAL
  Filled 2021-08-31 (×2): qty 16

## 2021-08-31 MED ORDER — ENOXAPARIN SODIUM 30 MG/0.3ML IJ SOSY
30.0000 mg | PREFILLED_SYRINGE | INTRAMUSCULAR | Status: DC
Start: 2021-08-31 — End: 2021-08-31

## 2021-08-31 MED ORDER — GUAIFENESIN 100 MG/5ML PO LIQD: 10.0000 mL | ORAL | Status: DC | PRN

## 2021-08-31 MED ORDER — NITROGLYCERIN 0.4 MG SL SUBL
0.4000 mg | SUBLINGUAL_TABLET | SUBLINGUAL | Status: DC | PRN
  Administered 2021-09-01 – 2021-09-02 (×2): 0.4 mg via SUBLINGUAL
  Filled 2021-08-31 (×2): qty 1

## 2021-08-31 MED ORDER — GABAPENTIN 100 MG PO CAPS
200.0000 mg | ORAL_CAPSULE | Freq: Every day | ORAL | Status: DC
  Administered 2021-08-31 – 2021-09-02 (×3): 200 mg via ORAL
  Filled 2021-08-31 (×3): qty 2

## 2021-08-31 MED ORDER — OXYCODONE HCL 5 MG PO TABS
5.0000 mg | ORAL_TABLET | Freq: Once | ORAL | Status: AC
  Administered 2021-08-31: 5 mg via ORAL
  Filled 2021-08-31: qty 1

## 2021-08-31 MED ORDER — DOCUSATE SODIUM 100 MG PO CAPS
100.0000 mg | ORAL_CAPSULE | Freq: Two times a day (BID) | ORAL | Status: DC
  Administered 2021-08-31 – 2021-09-02 (×4): 100 mg via ORAL
  Filled 2021-08-31 (×4): qty 1

## 2021-08-31 MED ORDER — TAMSULOSIN HCL 0.4 MG PO CAPS
0.4000 mg | ORAL_CAPSULE | Freq: Every day | ORAL | Status: DC
  Administered 2021-08-31 – 2021-09-02 (×3): 0.4 mg via ORAL
  Filled 2021-08-31 (×3): qty 1

## 2021-08-31 MED ORDER — FLUOXETINE HCL 20 MG PO CAPS
20.0000 mg | ORAL_CAPSULE | Freq: Every day | ORAL | Status: DC
  Administered 2021-09-01 – 2021-09-03 (×3): 20 mg via ORAL
  Filled 2021-08-31 (×3): qty 1

## 2021-08-31 NOTE — Progress Notes (Signed)
       CROSS COVER NOTE  NAME: Mir Fullilove MRN: [id number] DOB : 1940/05/16    Date of Service   08/31/21  HPI/Events of Note   Medication request received for pain meds to address ongoing 10/10 chest pain. Mr Nawabi is declining to take nitro and requests his home Oxycodone. On review of chart Mr Waterson was found to have (L) sided chest pain exacerbated by exertion and reproducible on exam.   Interventions   Plan: Oxycodone '5mg'$  q4H PRN x 3 doses    This document was prepared using Dragon voice recognition software and may include unintentional dictation errors.  Neomia Glass DNP, MHA, FNP-BC Nurse Practitioner [facility] Pager [phone]
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
## 2021-08-31 NOTE — Progress Notes (Signed)
Patient here for oncology follow-up appointment,  concerns of chest pain, SOB and dizziness

## 2021-08-31 NOTE — ED Provider Notes (Signed)
[facility] Provider Note    Event Date/Time   First MD Initiated Contact with Patient 08/31/21 1256     (approximate)   History   Chest Pain   HPI  Jason Lin is a 81 y.o. male   who has history of Merkel cell carcinoma followed in oncology clinic presents to the ER from clinic after having more than 2 weeks of weakness chest pain and discomfort.  Is having some dyspnea on exertion.  He denies any pleuritic pain at this time.  Denies any new medications.        Physical Exam   Triage Vital Signs: ED Triage Vitals  Enc Vitals Group     BP 08/31/21 1258 (!) 165/72     Pulse Rate 08/31/21 1258 (!) 43     Resp 08/31/21 1258 19     Temp 08/31/21 1258 (!) 97.3 F (36.3 C)     Temp Source 08/31/21 1258 Oral     SpO2 08/31/21 1258 100 %     Weight --      Height --      Head Circumference --      Peak Flow --      Pain Score 08/31/21 1246 10     Pain Loc --      Pain Edu? --      Excl. in [place]? --     Most recent vital signs: Vitals:   08/31/21 1445 08/31/21 1500  BP: (!) 175/73   Pulse: (!) 43 (!) 52  Resp: 14 16  Temp:    SpO2: 100% 96%     Constitutional: Alert  Eyes: Conjunctivae are normal.  Head: Atraumatic. Nose: No congestion/rhinnorhea. Mouth/Throat: Mucous membranes are moist.   Neck: Painless ROM.  Cardiovascular:   bradycardic Respiratory: Normal respiratory effort.  No retractions.  Gastrointestinal: Soft and nontender.  Musculoskeletal:  no deformity Neurologic:  MAE spontaneously. No gross focal neurologic deficits are appreciated.  Skin:  Skin is warm, dry and intact. No rash noted. Psychiatric: Mood and affect are normal. Speech and behavior are normal.    ED Results / Procedures / Treatments   Labs (all labs ordered are listed, but only abnormal results are displayed) Labs Reviewed  CBC WITH DIFFERENTIAL/PLATELET - Abnormal; Notable for the following components:      Result Value   RBC 3.75 (*)     Hemoglobin 11.9 (*)    HCT 37.6 (*)    MCV 100.3 (*)    All other components within normal limits  COMPREHENSIVE METABOLIC PANEL - Abnormal; Notable for the following components:   CO2 20 (*)    BUN 30 (*)    Creatinine, Ser 2.52 (*)    Calcium 8.8 (*)    Total Protein 6.2 (*)    AST 14 (*)    Alkaline Phosphatase 33 (*)    GFR, Estimated 25 (*)    All other components within normal limits  D-DIMER, QUANTITATIVE - Abnormal; Notable for the following components:   D-Dimer, Quant 0.75 (*)    All other components within normal limits  TROPONIN I (HIGH SENSITIVITY)  TROPONIN I (HIGH SENSITIVITY)     EKG  ED ECG REPORT I, Merlyn Lot, the attending physician, personally viewed and interpreted this ECG.   Date: 08/31/2021  EKG Time: 12:46  Rate: 50  Rhythm: sinus bradycardia  Axis: normal  Intervals: noramal qt  ST&T Change: no stemi, no depressions    RADIOLOGY Please see ED Course for  my review and interpretation.  I personally reviewed all radiographic images ordered to evaluate for the above acute complaints and reviewed radiology reports and findings.  These findings were personally discussed with the patient.  Please see medical record for radiology report.    PROCEDURES:  Critical Care performed: No  .1-3 Lead EKG Interpretation  Performed by: Merlyn Lot, MD Authorized by: Merlyn Lot, MD     Interpretation: abnormal     ECG rate:  40   ECG rate assessment: bradycardic     Rhythm: sinus bradycardia      MEDICATIONS ORDERED IN ED: Medications  oxyCODONE (Oxy IR/ROXICODONE) immediate release tablet 5 mg (5 mg Oral Given 08/31/21 1343)     IMPRESSION / MDM / ASSESSMENT AND PLAN / ED COURSE  I reviewed the triage vital signs and the nursing notes.                              Differential diagnosis includes, but is not limited to, ACS, pericarditis, esophagitis, boerhaaves, pe, dissection, pna, bronchitis, costochondritis   Patient  presented to the ER for evaluation of symptoms as described above.  This presenting complaint could reflect a potentially life-threatening illness therefore the patient will be placed on continuous pulse oximetry and telemetry for monitoring.  Laboratory evaluation will be sent to evaluate for the above complaints.   Patient is in sinus bradycardia.  EKG is nonischemic.  Does have a history of malignancy as well as CKD.  He is requesting something for pain.  He is not on any beta-blockers that I can see in his record.  Does not have pacer.   Clinical Course as of 08/31/21 1543  Mon Aug 31, 2021  1324 X-ray on my review and interpretation does not show any evidence of infiltrate will await formal radiology report. [PR]  1027 Age-adjusted D-dimer is negative,  in the setting of renal insufficiency likely also elevated.  Previous D-dimer was more significantly elevated and work-up for PE DVT was negative at that time.  Low suspicion for PE.  Patient is persistently bradycardic., it is a sinus bradycardia on monitor in the low 40's. awaiting trop  [PR]  1543 Patient strep is negative.  Given his age chest pain and bradycardia in the low 40s without other explanation I do feel that observation in the hospital for formal chest pain rule out indicated.  I have consulted hospitalist for admission. [PR]    Clinical Course User Index [PR] Merlyn Lot, MD     FINAL CLINICAL IMPRESSION(S) / ED DIAGNOSES   Final diagnoses:  Symptomatic bradycardia     Rx / DC Orders   ED Discharge Orders     None        Note:  This document was prepared using Dragon voice recognition software and may include unintentional dictation errors.    Merlyn Lot, MD 08/31/21 [phone]
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
## 2021-08-31 NOTE — Telephone Encounter (Signed)
We received a secure chat  stating" Hi, the above patient is here now.  The [facility] brought him.  He was scheduled for lab, Dr.Yu, and Dr.Chrystal.   All appts was canceled.  They are wanting to see if he can be rescheduled to be seen today if possible?  Thanks" Per recent in basket appointments was canceled " Per Dr. Tasia Catchings, PET scan showed treatment response and no additional treatment is needed right away. Pt's brother Laveda Abbe has been informed of this.please cancel appts on 8/7 (lab/MD) with Dr. Tasia Catchings & schedule MD only on 10/26/21 @ 2pm. Laveda Abbe is aware of appt changes."   Dr.chrystal team could not see patient today but dr.yu is able to see patient.

## 2021-08-31 NOTE — ED Notes (Signed)
Patient resting quietly in bed with eyes closed. Resp even, unlabored on RA. SB at 93's. No distress noted at this time.

## 2021-08-31 NOTE — ED Notes (Signed)
Patient reports 10/10 continued chest pain. Patient refusing to take nitro SL and states that he always takes oxycodone 5 mg TID and wants a dose now. Morton Amy messaged via secure chat. Patient AOX4, resp even, unlabored on RA. 100 ml urine emptied from urinal. Posey alarm turned on. Patient refuses to allow RN to keep both siderails up. States, "I have never fallen out of bed!" Refused to be repositioned for comfort. NS infusing at 50 ml/hr with no signs of infiltration.

## 2021-08-31 NOTE — ED Triage Notes (Signed)
Pt to ED via the cancer center. Pt states that he has been having chest pain for "a while". Pt states that the pain got worse this morning. Pt states that he is actively having chest pain. Pain is in the left upper chest and epigastric area. Pt states that he has been having dizziness since this morning as well. Pt states that he has hx/o cancer but is unsure why he was at the cancer center today.
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
## 2021-09-01 ENCOUNTER — Encounter: Payer: Self-pay | Admitting: Family Medicine

## 2021-09-01 ENCOUNTER — Observation Stay
Admit: 2021-09-01 | Discharge: 2021-09-01 | Disposition: A | Discharge status: Home / Self Care | Payer: Medicare PPO | Attending: Family Medicine | Admitting: Family Medicine

## 2021-09-01 DIAGNOSIS — R001 Bradycardia, unspecified: Secondary | ICD-10-CM | POA: Diagnosis not present

## 2021-09-01 LAB — COMPREHENSIVE METABOLIC PANEL
ALT: 11 U/L (ref 0–44)
AST: 13 U/L — ABNORMAL LOW (ref 15–41)
Albumin: 3.3 g/dL — ABNORMAL LOW (ref 3.5–5.0)
Alkaline Phosphatase: 27 U/L — ABNORMAL LOW (ref 38–126)
Anion gap: 7 (ref 5–15)
BUN: 30 mg/dL — ABNORMAL HIGH (ref 8–23)
CO2: 20 mmol/L — ABNORMAL LOW (ref 22–32)
Calcium: 8.8 mg/dL — ABNORMAL LOW (ref 8.9–10.3)
Chloride: 113 mmol/L — ABNORMAL HIGH (ref 98–111)
Creatinine, Ser: 2.26 mg/dL — ABNORMAL HIGH (ref 0.61–1.24)
GFR, Estimated: 28 mL/min — ABNORMAL LOW (ref >60)
Glucose, Bld: 98 mg/dL (ref 70–99)
Potassium: 4.1 mmol/L (ref 3.5–5.1)
Sodium: 140 mmol/L (ref 135–145)
Total Bilirubin: 1.5 mg/dL — ABNORMAL HIGH (ref 0.3–1.2)
Total Protein: 6 g/dL — ABNORMAL LOW (ref 6.5–8.1)

## 2021-09-01 LAB — ECHOCARDIOGRAM COMPLETE
AR max vel: 3.35 cm2
AV Area VTI: 2.94 cm2
AV Area mean vel: 3.16 cm2
AV Mean grad: 2 mmHg
AV Peak grad: 3.4 mmHg
Ao pk vel: 0.92 m/s
Area-P 1/2: 2.96 cm2
Calc EF: 56.9 %
Height: 65 in
P 1/2 time: 728 msec
S' Lateral: 4.28 cm
Single Plane A2C EF: 57.2 %
Single Plane A4C EF: 56.9 %
Weight: 2576 oz

## 2021-09-01 LAB — CBC
HCT: 34.7 % — ABNORMAL LOW (ref 39.0–52.0)
Hemoglobin: 11.2 g/dL — ABNORMAL LOW (ref 13.0–17.0)
MCH: 31.5 pg (ref 26.0–34.0)
MCHC: 32.3 g/dL (ref 30.0–36.0)
MCV: 97.5 fL (ref 80.0–100.0)
Platelets: 142 10*3/uL — ABNORMAL LOW (ref 150–400)
RBC: 3.56 MIL/uL — ABNORMAL LOW (ref 4.22–5.81)
RDW: 12.4 % (ref 11.5–15.5)
WBC: 4.8 10*3/uL (ref 4.0–10.5)
nRBC: 0 % (ref 0.0–0.2)

## 2021-09-01 LAB — TROPONIN I (HIGH SENSITIVITY): Troponin I (High Sensitivity): 6 ng/L (ref <18)

## 2021-09-01 LAB — MAGNESIUM: Magnesium: 2.1 mg/dL (ref 1.7–2.4)

## 2021-09-01 LAB — PHOSPHORUS: Phosphorus: 3.8 mg/dL (ref 2.5–4.6)

## 2021-09-01 MED ORDER — METHYLPREDNISOLONE SODIUM SUCC 40 MG IJ SOLR
40.0000 mg | Freq: Two times a day (BID) | INTRAMUSCULAR | Status: DC
  Administered 2021-09-01 – 2021-09-02 (×2): 40 mg via INTRAVENOUS
  Filled 2021-09-01 (×3): qty 1

## 2021-09-01 MED ORDER — PERFLUTREN LIPID MICROSPHERE
1.0000 mL | INTRAVENOUS | Status: AC | PRN
  Administered 2021-09-01: 3 mL via INTRAVENOUS

## 2021-09-01 MED ORDER — LORATADINE 10 MG PO TABS
10.0000 mg | ORAL_TABLET | Freq: Every day | ORAL | Status: DC
  Administered 2021-09-01 – 2021-09-03 (×3): 10 mg via ORAL
  Filled 2021-09-01 (×3): qty 1

## 2021-09-01 MED ORDER — ORAL CARE MOUTH RINSE
15.0000 mL | OROMUCOSAL | Status: DC | PRN
Start: 2021-09-01 — End: 2021-09-03

## 2021-09-01 MED ORDER — HYDRALAZINE HCL 20 MG/ML IJ SOLN: 10.0000 mg | Freq: Four times a day (QID) | INTRAMUSCULAR | Status: DC | PRN

## 2021-09-01 MED ORDER — OXYCODONE HCL 5 MG PO TABS
2.5000 mg | ORAL_TABLET | Freq: Four times a day (QID) | ORAL | Status: DC | PRN
  Administered 2021-09-01 – 2021-09-02 (×3): 2.5 mg via ORAL
  Filled 2021-09-01 (×3): qty 1

## 2021-09-01 MED ORDER — HEPARIN SODIUM (PORCINE) 5000 UNIT/ML IJ SOLN
5000.0000 [IU] | Freq: Three times a day (TID) | INTRAMUSCULAR | Status: DC
  Administered 2021-09-01 – 2021-09-03 (×5): 5000 [IU] via SUBCUTANEOUS
  Filled 2021-09-01 (×5): qty 1

## 2021-09-01 NOTE — Progress Notes (Signed)
*  PRELIMINARY RESULTS* Echocardiogram 2D Echocardiogram has been performed.  Jason Lin 09/01/2021, 10:13 AM

## 2021-09-01 NOTE — Progress Notes (Signed)
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  Chest pain seems musculoskeletal associated with shortness of breath,  gets worse with exertion.  Workup reveals sinus bradycardia.  Troponin x 3 negative.  Cardiology is consulted,  awaiting recommendation. Patient is admitted for chest pain rule out ACS, found to have symptomatic bradycardia.  Assessment & Plan:   Principal Problem:   Symptomatic bradycardia Active Problems:   Axillary lymphadenopathy   HTN (hypertension)   HLD (hyperlipidemia)   Depression   BPH (benign prostatic hyperplasia)   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HCC)   CAD (coronary artery disease)   Stage 3b chronic kidney disease (CKD) (HCC)   GERD (gastroesophageal reflux disease)   Chest pain: Patient presented with left-sided chest pain radiating towards left arm. Chest pain seems reproducible, likely musculoskeletal. Troponin x 2 negative, continue to trend troponin EKG shows sinus bradycardia. Continue aspirin, simvastatin. 2D echo shows LVEF 60 to 65%, no regional wall motion abnormalities. Cardiology is consulted,  awaiting recommendation.   Symptomatic bradycardia: EKG shows sinus bradycardia , heart rate ranging between 48-53 Patient reports shortness of breath associated with the chest pain. Patient takes metoprolol at home.  Hold metoprolol for now. Avoid AV nodal medications. Cardiology is consulted.  No indication for pacemaker at this time.   Essential hypertension: Hydralazine as needed as needed. Hold metoprolol due to low heart rate.   Major depression: Continue fluoxetine 20 mg daily   Hyperlipidemia: Continue simvastatin 20 mg daily   BPH: Continue  Flomax 0.4 mg p.o. daily     CAD: Continue simvastatin,  hold metoprolol.   CKD stage IIIb: Serum creatinine at baseline.  Baseline creatinine 2.3-2.5. Avoid nephrotoxic medications, monitor serum creatinine.     GERD: Continue pantoprazole.   Merkel cell carcinoma: Continue outpatient follow-up with oncology   DVT prophylaxis: Lovenox Code Status: Full code Family Communication: No family at bedside Disposition Plan:   Status is: Observation The patient remains OBS appropriate and will d/c before 2 midnights.  Admitted for generalized weakness and chest pain found to have sinus bradycardia.   Cardiology is consulted,  echocardiogram is normal.  Needs PT and OT eval before discharge.\  Anticipated discharge home in 1 to 2 days.   Consultants:  Cardiology  Procedures: Echocardiogram Antimicrobials: None  Subjective: Patient was seen and examined at bedside.  Overnight events noted.   Patient reports feeling better , he still complains of having chest pain which gets better with oxycodone.   Patient refused to take nitro overnight  Objective: Vitals:   09/01/21 0901 09/01/21 1000 09/01/21 1058 09/01/21 1321  BP: (!) 140/77 (!) 144/80 139/77 (!) 150/64  Pulse: (!) 44 (!) 41 (!) 39 (!) 46  Resp: 17 17 (!) 23 (!) 22  Temp: 98.1 F (36.7 C) 98.7 F (37.1 C) 97.7 F (36.5 C) 97.6 F (36.4 C)  TempSrc: Oral   Oral  SpO2: 99% 98% 99% 100%  Weight:      Height:        Intake/Output Summary (Last 24 hours) at 09/01/2021 1411 Last data filed at 09/01/2021 1321 Gross per 24 hour  Intake 240 ml  Output --  Net 240 ml   Filed Weights   08/31/21 1951  Weight: 73  kg    Examination:  General exam: Appears comfortable, not in any acute distress.  Deconditioned Respiratory system: CTA bilaterally, no wheezing, no crackles, normal respiratory effort. Cardiovascular system: S1 & S2 heard, regular rate and rhythm, no murmur. Gastrointestinal system: Abdomen is soft,  non tender, non distended, BS+ Central nervous system: Alert and oriented x2. No focal neurological deficits. Extremities: No edema, no cyanosis, no clubbing Skin: No rashes, lesions or ulcers Psychiatry:. Mood & affect appropriate.     Data Reviewed: I have personally reviewed following labs and imaging studies  CBC: Recent Labs  Lab 08/31/21 1009 08/31/21 1350 09/01/21 0544  WBC 4.9 5.8 4.8  NEUTROABS 3.6 4.1  --   HGB 11.7* 11.9* 11.2*  HCT 36.8* 37.6* 34.7*  MCV 99.7 100.3* 97.5  PLT 150 151 938*   Basic Metabolic Panel: Recent Labs  Lab 08/31/21 1009 08/31/21 1350 09/01/21 0544  NA 138 137 140  K 4.3 4.6 4.1  CL 107 111 113*  CO2 26 20* 20*  GLUCOSE 103* 92 98  BUN 29* 30* 30*  CREATININE 2.61* 2.52* 2.26*  CALCIUM 8.9 8.8* 8.8*  MG  --   --  2.1  PHOS  --   --  3.8   GFR: Estimated Creatinine Clearance: 22.3 mL/min (A) (by C-G formula based on SCr of 2.26 mg/dL (H)). Liver Function Tests: Recent Labs  Lab 08/31/21 1009 08/31/21 1350 09/01/21 0544  AST 11* 14* 13*  ALT '12 11 11  '$ ALKPHOS 32* 33* 27*  BILITOT 1.1 1.2 1.5*  PROT 6.3* 6.2* 6.0*  ALBUMIN 3.6 3.6 3.3*   No results for input(s): "LIPASE", "AMYLASE" in the last 168 hours. No results for input(s): "AMMONIA" in the last 168 hours. Coagulation Profile: No results for input(s): "INR", "PROTIME" in the last 168 hours. Cardiac Enzymes: No results for input(s): "CKTOTAL", "CKMB", "CKMBINDEX", "TROPONINI" in the last 168 hours. BNP (last 3 results) No results for input(s): "PROBNP" in the last 8760 hours. HbA1C: No results for input(s): "HGBA1C" in the last 72 hours. CBG: No results for input(s): "GLUCAP" in the last 168 hours. Lipid Profile: No results for input(s): "CHOL", "HDL", "LDLCALC", "TRIG", "CHOLHDL", "LDLDIRECT" in the last 72 hours. Thyroid Function Tests: Recent Labs    08/31/21 1823  TSH 3.720   Anemia Panel: No results for input(s): "VITAMINB12", "FOLATE", "FERRITIN",  "TIBC", "IRON", "RETICCTPCT" in the last 72 hours. Sepsis Labs: No results for input(s): "PROCALCITON", "LATICACIDVEN" in the last 168 hours.  No results found for this or any previous visit (from the past 240 hour(s)).   Radiology Studies: ECHOCARDIOGRAM COMPLETE  Result Date: 09/01/2021    ECHOCARDIOGRAM REPORT   Patient Name:   JAMESMICHAEL Viswanathan Date of Exam: 09/01/2021 Medical Rec #:  [id number]      Height:       65.0 in Accession #:    [id number]     Weight:       161.0 lb Date of Birth:  1940/06/23      BSA:          1.804 m Patient Age:    67 years       BP:           155/69 mmHg Patient Gender: M              HR:           43 bpm. Exam Location:  [facility] Procedure: 2D Echo, Color Doppler, Cardiac Doppler and Intracardiac  Opacification Agent Indications:     R07.9 Chest Pain  History:         Patient has no prior history of Echocardiogram examinations.                  Signs/Symptoms:Shortness of Breath and Fatigue; Risk                  Factors:Hypertension and Dyslipidemia.  Sonographer:     Charmayne Sheer Referring Phys:  [place] Diagnosing Phys: Yolonda Kida MD  Sonographer Comments: Technically difficult study due to poor echo windows. IMPRESSIONS  1. Left ventricular ejection fraction, by estimation, is 60 to 65%. The left ventricle has normal function. The left ventricle has no regional wall motion abnormalities. There is mild left ventricular hypertrophy. Left ventricular diastolic parameters were normal.  2. Right ventricular systolic function is normal. The right ventricular size is normal.  3. The mitral valve is normal in structure. No evidence of mitral valve regurgitation. No evidence of mitral stenosis.  4. The aortic valve is normal in structure. Aortic valve regurgitation is not visualized. Aortic valve sclerosis/calcification is present, without any evidence of aortic stenosis.  5. The inferior vena cava is normal in size with greater than 50% respiratory  variability, suggesting right atrial pressure of 3 mmHg. FINDINGS  Left Ventricle: Left ventricular ejection fraction, by estimation, is 60 to 65%. The left ventricle has normal function. The left ventricle has no regional wall motion abnormalities. Definity contrast agent was given IV to delineate the left ventricular  endocardial borders. The left ventricular internal cavity size was normal in size. There is mild left ventricular hypertrophy. Left ventricular diastolic parameters were normal. Right Ventricle: The right ventricular size is normal. No increase in right ventricular wall thickness. Right ventricular systolic function is normal. Left Atrium: Left atrial size was normal in size. Right Atrium: Right atrial size was normal in size. Pericardium: There is no evidence of pericardial effusion. Mitral Valve: The mitral valve is normal in structure. No evidence of mitral valve regurgitation. No evidence of mitral valve stenosis. Tricuspid Valve: The tricuspid valve is normal in structure. Tricuspid valve regurgitation is not demonstrated. No evidence of tricuspid stenosis. Aortic Valve: The aortic valve is normal in structure. Aortic valve regurgitation is not visualized. Aortic regurgitation PHT measures 728 msec. Aortic valve sclerosis/calcification is present, without any evidence of aortic stenosis. Aortic valve mean gradient measures 2.0 mmHg. Aortic valve peak gradient measures 3.4 mmHg. Aortic valve area, by VTI measures 2.94 cm. Pulmonic Valve: The pulmonic valve was normal in structure. Pulmonic valve regurgitation is not visualized. No evidence of pulmonic stenosis. Aorta: The aortic root is normal in size and structure. Venous: The inferior vena cava is normal in size with greater than 50% respiratory variability, suggesting right atrial pressure of 3 mmHg. IAS/Shunts: No atrial level shunt detected by color flow Doppler.  LEFT VENTRICLE PLAX 2D LVIDd:         5.20 cm      Diastology LVIDs:          4.28 cm      LV e' medial:    7.51 cm/s LV PW:         1.03 cm      LV E/e' medial:  7.9 LV IVS:        0.80 cm      LV e' lateral:   8.16 cm/s LVOT diam:     2.10 cm  LV E/e' lateral: 7.3 LV SV:         62 LV SV Index:   34 LVOT Area:     3.46 cm  LV Volumes (MOD) LV vol d, MOD A2C: 122.0 ml LV vol d, MOD A4C: 119.0 ml LV vol s, MOD A2C: 52.2 ml LV vol s, MOD A4C: 51.3 ml LV SV MOD A2C:     69.8 ml LV SV MOD A4C:     119.0 ml LV SV MOD BP:      69.8 ml RIGHT VENTRICLE RV Basal diam:  4.46 cm LEFT ATRIUM             Index        RIGHT ATRIUM           Index LA diam:        3.70 cm 2.05 cm/m   RA Area:     22.40 cm LA Vol (A2C):   39.1 ml 21.67 ml/m  RA Volume:   70.10 ml  38.86 ml/m LA Vol (A4C):   36.0 ml 19.96 ml/m LA Biplane Vol: 38.5 ml 21.34 ml/m  AORTIC VALVE                    PULMONIC VALVE AV Area (Vmax):    3.35 cm     PV Vmax:       0.82 m/s AV Area (Vmean):   3.16 cm     PV Peak grad:  2.7 mmHg AV Area (VTI):     2.94 cm AV Vmax:           91.80 cm/s AV Vmean:          63.200 cm/s AV VTI:            0.210 m AV Peak Grad:      3.4 mmHg AV Mean Grad:      2.0 mmHg LVOT Vmax:         88.80 cm/s LVOT Vmean:        57.600 cm/s LVOT VTI:          0.178 m LVOT/AV VTI ratio: 0.85 AI PHT:            728 msec  AORTA Ao Root diam: 3.20 cm MITRAL VALVE MV Area (PHT): 2.96 cm    SHUNTS MV Decel Time: 256 msec    Systemic VTI:  0.18 m MV E velocity: 59.60 cm/s  Systemic Diam: 2.10 cm MV A velocity: 54.40 cm/s MV E/A ratio:  1.10 Dwayne Prince [place] MD Electronically signed by Yolonda Kida MD Signature Date/Time: 09/01/2021/1:46:16 PM    Final    DG Chest Portable 1 View  Result Date: 08/31/2021 CLINICAL DATA:  Weakness and chest pain EXAM: PORTABLE CHEST 1 VIEW COMPARISON:  08/06/2021 FINDINGS: The heart size and mediastinal contours are within normal limits. Both lungs are clear. The visualized skeletal structures are unremarkable. IMPRESSION: No active disease. Electronically Signed   By: Nelson Chimes M.D.   On: 08/31/2021 13:24     Scheduled Meds:  aspirin  81 mg Oral Daily   docusate sodium  100 mg Oral BID   dorzolamide-timolol  1 drop Both Eyes Q12H   FLUoxetine  20 mg Oral Daily   fluticasone  2 spray Each Nare Daily   gabapentin  200 mg Oral QHS   latanoprost  1 drop Both Eyes QHS   mometasone-formoterol  2 puff Inhalation BID   senna-docusate  2 tablet Oral BID  simvastatin  20 mg Oral Daily   tamsulosin  0.4 mg Oral QPC supper   traZODone  100 mg Oral QHS   Continuous Infusions:  sodium chloride 50 mL/hr at 08/31/21 1819     LOS: 0 days    Time spent: 35 mins    Akin Yi, MD [facility]   If 7PM-7AM, please contact night-coverage

## 2021-09-01 NOTE — Progress Notes (Signed)
[facility] Cardiology    SUBJECTIVE: Patient resting comfortably complains of significant chest pain no blackout spells or syncope denies any vertigo.  Patient complains of skin cancer removal with a tender spot   Vitals:   09/01/21 0901 09/01/21 1000 09/01/21 1058 09/01/21 1321  BP: (!) 140/77 (!) 144/80 139/77 (!) 150/64  Pulse: (!) 44 (!) 41 (!) 39 (!) 46  Resp: 17 17 (!) 23 (!) 22  Temp: 98.1 F (36.7 C) 98.7 F (37.1 C) 97.7 F (36.5 C) 97.6 F (36.4 C)  TempSrc: Oral   Oral  SpO2: 99% 98% 99% 100%  Weight:      Height:         Intake/Output Summary (Last 24 hours) at 09/01/2021 1359 Last data filed at 09/01/2021 1321 Gross per 24 hour  Intake 240 ml  Output --  Net 240 ml      PHYSICAL EXAM  General: Well developed, well nourished, in no acute distress HEENT:  Normocephalic and atramatic Neck:  No JVD.  Lungs: Diffuse rhonchi faint expiratory wheezing throughout bilaterally to auscultation and percussion. Heart: Bradycardic. Normal S1 and S2 without gallops or murmurs.  Abdomen: Bowel sounds are positive, abdomen soft and non-tender  Msk:  Back normal, normal gait. Normal strength and tone for age. Extremities: No clubbing, cyanosis or edema.   Neuro: Alert and oriented X 3. Psych:  Good affect, responds appropriately   LABS: Basic Metabolic Panel: Recent Labs    08/31/21 1350 09/01/21 0544  NA 137 140  K 4.6 4.1  CL 111 113*  CO2 20* 20*  GLUCOSE 92 98  BUN 30* 30*  CREATININE 2.52* 2.26*  CALCIUM 8.8* 8.8*  MG  --  2.1  PHOS  --  3.8   Liver Function Tests: Recent Labs    08/31/21 1350 09/01/21 0544  AST 14* 13*  ALT 11 11  ALKPHOS 33* 27*  BILITOT 1.2 1.5*  PROT 6.2* 6.0*  ALBUMIN 3.6 3.3*   No results for input(s): "LIPASE", "AMYLASE" in the last 72 hours. CBC: Recent Labs    08/31/21 1009 08/31/21 1350 09/01/21 0544  WBC 4.9 5.8 4.8  NEUTROABS 3.6 4.1  --   HGB 11.7* 11.9* 11.2*  HCT 36.8* 37.6* 34.7*  MCV 99.7 100.3* 97.5  PLT  150 151 142*   Cardiac Enzymes: No results for input(s): "CKTOTAL", "CKMB", "CKMBINDEX", "TROPONINI" in the last 72 hours. BNP: Invalid input(s): "POCBNP" D-Dimer: Recent Labs    08/31/21 1350  DDIMER 0.75*   Hemoglobin A1C: No results for input(s): "HGBA1C" in the last 72 hours. Fasting Lipid Panel: No results for input(s): "CHOL", "HDL", "LDLCALC", "TRIG", "CHOLHDL", "LDLDIRECT" in the last 72 hours. Thyroid Function Tests: Recent Labs    08/31/21 1823  TSH 3.720   Anemia Panel: No results for input(s): "VITAMINB12", "FOLATE", "FERRITIN", "TIBC", "IRON", "RETICCTPCT" in the last 72 hours.  ECHOCARDIOGRAM COMPLETE  Result Date: 09/01/2021    ECHOCARDIOGRAM REPORT   Patient Name:   Jason Lin Date of Exam: 09/01/2021 Medical Rec #:  [id number]      Height:       65.0 in Accession #:    [id number]     Weight:       161.0 lb Date of Birth:  1940/11/14      BSA:          1.804 m Patient Age:    81 years       BP:           155/69  mmHg Patient Gender: M              HR:           43 bpm. Exam Location:  [facility] Procedure: 2D Echo, Color Doppler, Cardiac Doppler and Intracardiac            Opacification Agent Indications:     R07.9 Chest Pain  History:         Patient has no prior history of Echocardiogram examinations.                  Signs/Symptoms:Shortness of Breath and Fatigue; Risk                  Factors:Hypertension and Dyslipidemia.  Sonographer:     Charmayne Sheer Referring Phys:  [place] Diagnosing Phys: Yolonda Kida MD  Sonographer Comments: Technically difficult study due to poor echo windows. IMPRESSIONS  1. Left ventricular ejection fraction, by estimation, is 60 to 65%. The left ventricle has normal function. The left ventricle has no regional wall motion abnormalities. There is mild left ventricular hypertrophy. Left ventricular diastolic parameters were normal.  2. Right ventricular systolic function is normal. The right ventricular size is normal.  3. The mitral  valve is normal in structure. No evidence of mitral valve regurgitation. No evidence of mitral stenosis.  4. The aortic valve is normal in structure. Aortic valve regurgitation is not visualized. Aortic valve sclerosis/calcification is present, without any evidence of aortic stenosis.  5. The inferior vena cava is normal in size with greater than 50% respiratory variability, suggesting right atrial pressure of 3 mmHg. FINDINGS  Left Ventricle: Left ventricular ejection fraction, by estimation, is 60 to 65%. The left ventricle has normal function. The left ventricle has no regional wall motion abnormalities. Definity contrast agent was given IV to delineate the left ventricular  endocardial borders. The left ventricular internal cavity size was normal in size. There is mild left ventricular hypertrophy. Left ventricular diastolic parameters were normal. Right Ventricle: The right ventricular size is normal. No increase in right ventricular wall thickness. Right ventricular systolic function is normal. Left Atrium: Left atrial size was normal in size. Right Atrium: Right atrial size was normal in size. Pericardium: There is no evidence of pericardial effusion. Mitral Valve: The mitral valve is normal in structure. No evidence of mitral valve regurgitation. No evidence of mitral valve stenosis. Tricuspid Valve: The tricuspid valve is normal in structure. Tricuspid valve regurgitation is not demonstrated. No evidence of tricuspid stenosis. Aortic Valve: The aortic valve is normal in structure. Aortic valve regurgitation is not visualized. Aortic regurgitation PHT measures 728 msec. Aortic valve sclerosis/calcification is present, without any evidence of aortic stenosis. Aortic valve mean gradient measures 2.0 mmHg. Aortic valve peak gradient measures 3.4 mmHg. Aortic valve area, by VTI measures 2.94 cm. Pulmonic Valve: The pulmonic valve was normal in structure. Pulmonic valve regurgitation is not visualized. No  evidence of pulmonic stenosis. Aorta: The aortic root is normal in size and structure. Venous: The inferior vena cava is normal in size with greater than 50% respiratory variability, suggesting right atrial pressure of 3 mmHg. IAS/Shunts: No atrial level shunt detected by color flow Doppler.  LEFT VENTRICLE PLAX 2D LVIDd:         5.20 cm      Diastology LVIDs:         4.28 cm      LV e' medial:    7.51 cm/s LV PW:  1.03 cm      LV E/e' medial:  7.9 LV IVS:        0.80 cm      LV e' lateral:   8.16 cm/s LVOT diam:     2.10 cm      LV E/e' lateral: 7.3 LV SV:         62 LV SV Index:   34 LVOT Area:     3.46 cm  LV Volumes (MOD) LV vol d, MOD A2C: 122.0 ml LV vol d, MOD A4C: 119.0 ml LV vol s, MOD A2C: 52.2 ml LV vol s, MOD A4C: 51.3 ml LV SV MOD A2C:     69.8 ml LV SV MOD A4C:     119.0 ml LV SV MOD BP:      69.8 ml RIGHT VENTRICLE RV Basal diam:  4.46 cm LEFT ATRIUM             Index        RIGHT ATRIUM           Index LA diam:        3.70 cm 2.05 cm/m   RA Area:     22.40 cm LA Vol (A2C):   39.1 ml 21.67 ml/m  RA Volume:   70.10 ml  38.86 ml/m LA Vol (A4C):   36.0 ml 19.96 ml/m LA Biplane Vol: 38.5 ml 21.34 ml/m  AORTIC VALVE                    PULMONIC VALVE AV Area (Vmax):    3.35 cm     PV Vmax:       0.82 m/s AV Area (Vmean):   3.16 cm     PV Peak grad:  2.7 mmHg AV Area (VTI):     2.94 cm AV Vmax:           91.80 cm/s AV Vmean:          63.200 cm/s AV VTI:            0.210 m AV Peak Grad:      3.4 mmHg AV Mean Grad:      2.0 mmHg LVOT Vmax:         88.80 cm/s LVOT Vmean:        57.600 cm/s LVOT VTI:          0.178 m LVOT/AV VTI ratio: 0.85 AI PHT:            728 msec  AORTA Ao Root diam: 3.20 cm MITRAL VALVE MV Area (PHT): 2.96 cm    SHUNTS MV Decel Time: 256 msec    Systemic VTI:  0.18 m MV E velocity: 59.60 cm/s  Systemic Diam: 2.10 cm MV A velocity: 54.40 cm/s MV E/A ratio:  1.10 Steadman Prosperi Prince [place] MD Electronically signed by Yolonda Kida MD Signature Date/Time: 09/01/2021/1:46:16 PM     Final    DG Chest Portable 1 View  Result Date: 08/31/2021 CLINICAL DATA:  Weakness and chest pain EXAM: PORTABLE CHEST 1 VIEW COMPARISON:  08/06/2021 FINDINGS: The heart size and mediastinal contours are within normal limits. Both lungs are clear. The visualized skeletal structures are unremarkable. IMPRESSION: No active disease. Electronically Signed   By: Nelson Chimes M.D.   On: 08/31/2021 13:24     Echo echocardiogram with preserved left ventricular function EF around 55%  TELEMETRY: Telemetry independently reviewed by me shows sinus bradycardia rate of around 55 nonspecific T2 changes:  ASSESSMENT  AND PLAN:  Principal Problem:   Symptomatic bradycardia Active Problems:   Axillary lymphadenopathy   HTN (hypertension)   HLD (hyperlipidemia)   Depression   BPH (benign prostatic hyperplasia)   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HCC)   CAD (coronary artery disease)   Stage 3b chronic kidney disease (CKD) (HCC)   GERD (gastroesophageal reflux disease)    Plan Patient will have a myocardial infarction EKG unchanged would recommend to consider functional Bradycardia no clear indication for permanent pacemaker at this point Recommend continue hypertension management and control Consider functional study with Lexiscan Myoview Significant COPD reactive airway disease possibly with wheezing consider COPD management antibiotics steroids inhaler Agree with reflux therapy possible Protonix Maintain adequate hydration for renal insufficiency consider nephrology input Continue statin therapy for hyperlipidemia   Yolonda Kida, MD, 09/01/2021 1:59 PM

## 2021-09-02 ENCOUNTER — Observation Stay: Payer: Medicare PPO

## 2021-09-02 DIAGNOSIS — R001 Bradycardia, unspecified: Secondary | ICD-10-CM | POA: Diagnosis not present

## 2021-09-02 LAB — BASIC METABOLIC PANEL
Anion gap: 3 — ABNORMAL LOW (ref 5–15)
BUN: 31 mg/dL — ABNORMAL HIGH (ref 8–23)
CO2: 20 mmol/L — ABNORMAL LOW (ref 22–32)
Calcium: 8.8 mg/dL — ABNORMAL LOW (ref 8.9–10.3)
Chloride: 114 mmol/L — ABNORMAL HIGH (ref 98–111)
Creatinine, Ser: 2.23 mg/dL — ABNORMAL HIGH (ref 0.61–1.24)
GFR, Estimated: 29 mL/min — ABNORMAL LOW (ref >60)
Glucose, Bld: 146 mg/dL — ABNORMAL HIGH (ref 70–99)
Potassium: 4.4 mmol/L (ref 3.5–5.1)
Sodium: 137 mmol/L (ref 135–145)

## 2021-09-02 LAB — CBC
HCT: 35 % — ABNORMAL LOW (ref 39.0–52.0)
Hemoglobin: 11.5 g/dL — ABNORMAL LOW (ref 13.0–17.0)
MCH: 31.3 pg (ref 26.0–34.0)
MCHC: 32.9 g/dL (ref 30.0–36.0)
MCV: 95.1 fL (ref 80.0–100.0)
Platelets: 153 10*3/uL (ref 150–400)
RBC: 3.68 MIL/uL — ABNORMAL LOW (ref 4.22–5.81)
RDW: 12.2 % (ref 11.5–15.5)
WBC: 5.2 10*3/uL (ref 4.0–10.5)
nRBC: 0 % (ref 0.0–0.2)

## 2021-09-02 LAB — PHOSPHORUS: Phosphorus: 3.2 mg/dL (ref 2.5–4.6)

## 2021-09-02 LAB — MAGNESIUM: Magnesium: 2 mg/dL (ref 1.7–2.4)

## 2021-09-02 MED ORDER — TECHNETIUM TC 99M TETROFOSMIN IV KIT
10.0000 | PACK | Freq: Once | INTRAVENOUS | Status: AC | PRN
  Administered 2021-09-02: 10.59 via INTRAVENOUS

## 2021-09-02 MED ORDER — PREDNISONE 20 MG PO TABS
20.0000 mg | ORAL_TABLET | Freq: Every day | ORAL | Status: DC
  Administered 2021-09-03: 20 mg via ORAL
  Filled 2021-09-02 (×2): qty 1

## 2021-09-02 MED ORDER — METHOCARBAMOL 500 MG PO TABS
500.0000 mg | ORAL_TABLET | Freq: Three times a day (TID) | ORAL | Status: DC
  Administered 2021-09-02 – 2021-09-03 (×4): 500 mg via ORAL
  Filled 2021-09-02 (×4): qty 1

## 2021-09-02 MED ORDER — REGADENOSON 0.4 MG/5ML IV SOLN
0.4000 mg | Freq: Once | INTRAVENOUS | Status: AC
  Administered 2021-09-02: 0.4 mg via INTRAVENOUS

## 2021-09-02 MED ORDER — AMLODIPINE BESYLATE 5 MG PO TABS
5.0000 mg | ORAL_TABLET | Freq: Every day | ORAL | Status: DC
  Administered 2021-09-02 – 2021-09-03 (×2): 5 mg via ORAL
  Filled 2021-09-02 (×2): qty 1

## 2021-09-02 MED ORDER — ACETAMINOPHEN 325 MG PO TABS
650.0000 mg | ORAL_TABLET | ORAL | Status: DC | PRN
  Administered 2021-09-02 – 2021-09-03 (×4): 650 mg via ORAL
  Filled 2021-09-02 (×4): qty 2

## 2021-09-02 MED ORDER — TECHNETIUM TC 99M TETROFOSMIN IV KIT
32.9900 | PACK | Freq: Once | INTRAVENOUS | Status: AC | PRN
  Administered 2021-09-02: 32.99 via INTRAVENOUS

## 2021-09-02 NOTE — Progress Notes (Signed)
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  Chest pain seems musculoskeletal associated with shortness of breath,  gets worse with exertion.  Workup reveals sinus bradycardia.  Troponin x 3 negative.  Cardiology is consulted,  awaiting recommendation. Patient is admitted for chest pain rule out ACS, found to have symptomatic bradycardia.   Negative lexiscan, however patient continues to endorse severe CP of unclear etiology   Assessment & Plan:   Principal Problem:   Symptomatic bradycardia Active Problems:   Axillary lymphadenopathy   HTN (hypertension)   HLD (hyperlipidemia)   Depression   BPH (benign prostatic hyperplasia)   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HCC)   CAD (coronary artery disease)   Stage 3b chronic kidney disease (CKD) (HCC)   GERD (gastroesophageal reflux disease)   Atypical chest pain Unclear etiology Lexiscan negative ??MSK vs GI Plan: Robaxin 500 TID PRN APAP Avoid nitro If symptoms improved anticipate dc tomorrow AM  Symptomatic bradycardia No workup indicated per cardiology No indication for PPM Avoid BB, AV nodal blocking agents  Essential hypertension Suboptimal control Add amlodipine '5mg'$  daily  MDD PTA prozac  HLD PTA simvastatin  BPF PTA fomax  CAD On statin as above No BB  CKD stage IIIb Cr baseline  GERD PPI  History of merkle cell CA Outpatient onc followup  DVT prophylaxis: SQ lovenox Code Status: FULL Family Communication:None Disposition Plan: Status is: Observation The patient remains OBS appropriate and will d/c before 2 midnights.  Patient will dc tomorrow AM if CP resolved Level of care: Telemetry Cardiac  Consultants:   Cardiology  Procedures:  Lexiscan stress test  Antimicrobials: None   Subjective: Seen and examined.  Continues to endorse severe CP  Objective: Vitals:   09/01/21 2328 09/02/21 0438 09/02/21 0749 09/02/21 1500  BP: (!) 143/62 (!) 143/72 (!) 160/75 (!) 160/80  Pulse: (!) 53 69 68   Resp: '19 19 20   '$ Temp: 98.5 F (36.9 C) 98.2 F (36.8 C) 98.6 F (37 C) 98.7 F (37.1 C)  TempSrc: Oral Oral    SpO2: 96% 96% 98%   Weight:      Height:        Intake/Output Summary (Last 24 hours) at 09/02/2021 1656 Last data filed at 09/02/2021 0930 Gross per 24 hour  Intake 1664.21 ml  Output 710 ml  Net 954.21 ml   Filed Weights   08/31/21 1951  Weight: 73 kg    Examination:  General exam: Appears calm and comfortable  Respiratory system: Clear to auscultation. Respiratory effort normal. Cardiovascular system: S1 S2, RRR, no murmur Gastrointestinal system: Soft NT ND, +bowel sounds Central nervous system: Alert and oriented. No focal neurological deficits. Extremities: Symmetric 5 x 5 power. Skin: No rashes, lesions or ulcers Psychiatry: Judgement and insight appear normal. Mood & affect appropriate.     Data Reviewed: I have personally reviewed following labs and imaging studies  CBC: Recent Labs  Lab 08/31/21 1009 08/31/21 1350 09/01/21 0544 09/02/21 0523  WBC 4.9 5.8 4.8 5.2  NEUTROABS 3.6 4.1  --   --   HGB 11.7* 11.9* 11.2* 11.5*  HCT 36.8* 37.6* 34.7* 35.0*  MCV 99.7 100.3* 97.5 95.1  PLT 150 151 142* 182   Basic Metabolic Panel: Recent  Labs  Lab 08/31/21 1009 08/31/21 1350 09/01/21 0544 09/02/21 0523  NA 138 137 140 137  K 4.3 4.6 4.1 4.4  CL 107 111 113* 114*  CO2 26 20* 20* 20*  GLUCOSE 103* 92 98 146*  BUN 29* 30* 30* 31*  CREATININE 2.61* 2.52* 2.26* 2.23*  CALCIUM 8.9 8.8* 8.8* 8.8*  MG  --   --  2.1 2.0  PHOS  --   --  3.8 3.2   GFR: Estimated Creatinine Clearance: 22.6 mL/min (A) (by C-G formula based on SCr of 2.23 mg/dL (H)). Liver  Function Tests: Recent Labs  Lab 08/31/21 1009 08/31/21 1350 09/01/21 0544  AST 11* 14* 13*  ALT '12 11 11  '$ ALKPHOS 32* 33* 27*  BILITOT 1.1 1.2 1.5*  PROT 6.3* 6.2* 6.0*  ALBUMIN 3.6 3.6 3.3*   No results for input(s): "LIPASE", "AMYLASE" in the last 168 hours. No results for input(s): "AMMONIA" in the last 168 hours. Coagulation Profile: No results for input(s): "INR", "PROTIME" in the last 168 hours. Cardiac Enzymes: No results for input(s): "CKTOTAL", "CKMB", "CKMBINDEX", "TROPONINI" in the last 168 hours. BNP (last 3 results) No results for input(s): "PROBNP" in the last 8760 hours. HbA1C: No results for input(s): "HGBA1C" in the last 72 hours. CBG: No results for input(s): "GLUCAP" in the last 168 hours. Lipid Profile: No results for input(s): "CHOL", "HDL", "LDLCALC", "TRIG", "CHOLHDL", "LDLDIRECT" in the last 72 hours. Thyroid Function Tests: Recent Labs    08/31/21 1823  TSH 3.720   Anemia Panel: No results for input(s): "VITAMINB12", "FOLATE", "FERRITIN", "TIBC", "IRON", "RETICCTPCT" in the last 72 hours. Sepsis Labs: No results for input(s): "PROCALCITON", "LATICACIDVEN" in the last 168 hours.  No results found for this or any previous visit (from the past 240 hour(s)).       Radiology Studies: ECHOCARDIOGRAM COMPLETE  Result Date: 09/01/2021    ECHOCARDIOGRAM REPORT   Patient Name:   Jason Lin Date of Exam: 09/01/2021 Medical Rec #:  [id number]      Height:       65.0 in Accession #:    [id number]     Weight:       161.0 lb Date of Birth:  1940-03-24      BSA:          1.804 m Patient Age:    19 years       BP:           155/69 mmHg Patient Gender: M              HR:           43 bpm. Exam Location:  [facility] Procedure: 2D Echo, Color Doppler, Cardiac Doppler and Intracardiac            Opacification Agent Indications:     R07.9 Chest Pain  History:         Patient has no prior history of Echocardiogram examinations.                  Signs/Symptoms:Shortness of  Breath and Fatigue; Risk                  Factors:Hypertension and Dyslipidemia.  Sonographer:     Charmayne Sheer Referring Phys:  [place] Diagnosing Phys: Yolonda Kida MD  Sonographer Comments: Technically difficult study due to poor echo windows. IMPRESSIONS  1. Left ventricular ejection fraction, by estimation, is 60 to 65%. The left ventricle has normal function. The left  ventricle has no regional wall motion abnormalities. There is mild left ventricular hypertrophy. Left ventricular diastolic parameters were normal.  2. Right ventricular systolic function is normal. The right ventricular size is normal.  3. The mitral valve is normal in structure. No evidence of mitral valve regurgitation. No evidence of mitral stenosis.  4. The aortic valve is normal in structure. Aortic valve regurgitation is not visualized. Aortic valve sclerosis/calcification is present, without any evidence of aortic stenosis.  5. The inferior vena cava is normal in size with greater than 50% respiratory variability, suggesting right atrial pressure of 3 mmHg. FINDINGS  Left Ventricle: Left ventricular ejection fraction, by estimation, is 60 to 65%. The left ventricle has normal function. The left ventricle has no regional wall motion abnormalities. Definity contrast agent was given IV to delineate the left ventricular  endocardial borders. The left ventricular internal cavity size was normal in size. There is mild left ventricular hypertrophy. Left ventricular diastolic parameters were normal. Right Ventricle: The right ventricular size is normal. No increase in right ventricular wall thickness. Right ventricular systolic function is normal. Left Atrium: Left atrial size was normal in size. Right Atrium: Right atrial size was normal in size. Pericardium: There is no evidence of pericardial effusion. Mitral Valve: The mitral valve is normal in structure. No evidence of mitral valve regurgitation. No evidence of mitral valve  stenosis. Tricuspid Valve: The tricuspid valve is normal in structure. Tricuspid valve regurgitation is not demonstrated. No evidence of tricuspid stenosis. Aortic Valve: The aortic valve is normal in structure. Aortic valve regurgitation is not visualized. Aortic regurgitation PHT measures 728 msec. Aortic valve sclerosis/calcification is present, without any evidence of aortic stenosis. Aortic valve mean gradient measures 2.0 mmHg. Aortic valve peak gradient measures 3.4 mmHg. Aortic valve area, by VTI measures 2.94 cm. Pulmonic Valve: The pulmonic valve was normal in structure. Pulmonic valve regurgitation is not visualized. No evidence of pulmonic stenosis. Aorta: The aortic root is normal in size and structure. Venous: The inferior vena cava is normal in size with greater than 50% respiratory variability, suggesting right atrial pressure of 3 mmHg. IAS/Shunts: No atrial level shunt detected by color flow Doppler.  LEFT VENTRICLE PLAX 2D LVIDd:         5.20 cm      Diastology LVIDs:         4.28 cm      LV e' medial:    7.51 cm/s LV PW:         1.03 cm      LV E/e' medial:  7.9 LV IVS:        0.80 cm      LV e' lateral:   8.16 cm/s LVOT diam:     2.10 cm      LV E/e' lateral: 7.3 LV SV:         62 LV SV Index:   34 LVOT Area:     3.46 cm  LV Volumes (MOD) LV vol d, MOD A2C: 122.0 ml LV vol d, MOD A4C: 119.0 ml LV vol s, MOD A2C: 52.2 ml LV vol s, MOD A4C: 51.3 ml LV SV MOD A2C:     69.8 ml LV SV MOD A4C:     119.0 ml LV SV MOD BP:      69.8 ml RIGHT VENTRICLE RV Basal diam:  4.46 cm LEFT ATRIUM             Index        RIGHT ATRIUM  Index LA diam:        3.70 cm 2.05 cm/m   RA Area:     22.40 cm LA Vol (A2C):   39.1 ml 21.67 ml/m  RA Volume:   70.10 ml  38.86 ml/m LA Vol (A4C):   36.0 ml 19.96 ml/m LA Biplane Vol: 38.5 ml 21.34 ml/m  AORTIC VALVE                    PULMONIC VALVE AV Area (Vmax):    3.35 cm     PV Vmax:       0.82 m/s AV Area (Vmean):   3.16 cm     PV Peak grad:  2.7 mmHg AV  Area (VTI):     2.94 cm AV Vmax:           91.80 cm/s AV Vmean:          63.200 cm/s AV VTI:            0.210 m AV Peak Grad:      3.4 mmHg AV Mean Grad:      2.0 mmHg LVOT Vmax:         88.80 cm/s LVOT Vmean:        57.600 cm/s LVOT VTI:          0.178 m LVOT/AV VTI ratio: 0.85 AI PHT:            728 msec  AORTA Ao Root diam: 3.20 cm MITRAL VALVE MV Area (PHT): 2.96 cm    SHUNTS MV Decel Time: 256 msec    Systemic VTI:  0.18 m MV E velocity: 59.60 cm/s  Systemic Diam: 2.10 cm MV A velocity: 54.40 cm/s MV E/A ratio:  1.10 Dwayne D Callwood MD Electronically signed by Yolonda Kida MD Signature Date/Time: 09/01/2021/1:46:16 PM    Final         Scheduled Meds:  amLODipine  5 mg Oral Daily   aspirin  81 mg Oral Daily   docusate sodium  100 mg Oral BID   dorzolamide-timolol  1 drop Both Eyes Q12H   FLUoxetine  20 mg Oral Daily   fluticasone  2 spray Each Nare Daily   gabapentin  200 mg Oral QHS   heparin injection (subcutaneous)  5,000 Units Subcutaneous Q8H   latanoprost  1 drop Both Eyes QHS   loratadine  10 mg Oral Daily   methocarbamol  500 mg Oral TID   mometasone-formoterol  2 puff Inhalation BID   [START ON 09/03/2021] predniSONE  20 mg Oral Q breakfast   senna-docusate  2 tablet Oral BID   simvastatin  20 mg Oral Daily   tamsulosin  0.4 mg Oral QPC supper   traZODone  100 mg Oral QHS   Continuous Infusions:   LOS: 0 days     Sidney Ace, MD [facility]   If 7PM-7AM, please contact night-coverage  09/02/2021, 4:56 PM

## 2021-09-02 NOTE — Evaluation (Signed)
Physical Therapy Evaluation Patient Details Name: Jason Lin MRN: [id number] DOB: February 22, 1940 Today's Date: 09/02/2021  History of Present Illness  Pt is 34 YOM admitted for symptomatic bradycardia, chest pain, gen weakness x2 wks, and SOB. PMH includes: dementia, depression, HLD, HTN, and skin CA.  Clinical Impression  Pt presents to PT in bed and agreeable to participate in therapy services. Pt performed STS and amb w RW w CGA. Pt able to amb 80 ft in hallway w RW, CGA, and no LOB. Amb w decr stride length and narrow BOS. Has decreased safety awareness and displays slightly impulsive behaviors. SpO2 WNL throughout session and HR in mid 50s prior to amb and 70s following. Pt reports having 4-5 falls in the last 6 months. Pt would benefit from skilled HHPT to address above deficits in balance, strength, and activity tolerance and promote optimal return to PLOF.          Recommendations for follow up therapy are one component of a multi-disciplinary discharge planning process, led by the attending physician.  Recommendations may be updated based on patient status, additional functional criteria and insurance authorization.  Follow Up Recommendations Home health PT      Assistance Recommended at Discharge Intermittent Supervision/Assistance  Patient can return home with the following  A little help with walking and/or transfers;Assistance with cooking/housework;Assist for transportation;A little help with bathing/dressing/bathroom    Equipment Recommendations Rolling walker (2 wheels)  Recommendations for Other Services       Functional Status Assessment Patient has had a recent decline in their functional status and demonstrates the ability to make significant improvements in function in a reasonable and predictable amount of time.     Precautions / Restrictions Precautions Precautions: Fall Restrictions Weight Bearing Restrictions: No      Mobility  Bed Mobility Overal bed  mobility: Modified Independent             General bed mobility comments: extra time Patient Response: Cooperative  Transfers Overall transfer level: Needs assistance Equipment used: Rolling walker (2 wheels) Transfers: Sit to/from Stand Sit to Stand: Min guard                Ambulation/Gait Ambulation/Gait assistance: Min guard Gait Distance (Feet): 80 Feet Assistive device: Rolling walker (2 wheels) Gait Pattern/deviations: Decreased step length - right, Decreased step length - left, Shuffle, Trunk flexed Gait velocity: decr        Stairs            Wheelchair Mobility    Modified Rankin (Stroke Patients Only)       Balance Overall balance assessment: History of Falls, Needs assistance Sitting-balance support: Feet supported, Single extremity supported Sitting balance-Leahy Scale: Good     Standing balance support: Bilateral upper extremity supported, During functional activity, Reliant on assistive device for balance Standing balance-Leahy Scale: Poor                               Pertinent Vitals/Pain Pain Assessment Pain Assessment: 0-10 Pain Score: 10-Worst pain ever Pain Location: abdomen and chest Pain Descriptors / Indicators: Grimacing, Headache Pain Intervention(s): Limited activity within patient's tolerance, Monitored during session, Premedicated before session    Home Living Family/patient expects to be discharged to:: Assisted living                 Home Equipment: Conservation officer, nature (2 wheels)      Prior Function Prior Level of Function :  Independent/Modified Independent;History of Falls (last six months)                     Hand Dominance        Extremity/Trunk Assessment   Upper Extremity Assessment Upper Extremity Assessment: Overall WFL for tasks assessed    Lower Extremity Assessment Lower Extremity Assessment: Generalized weakness       Communication   Communication: HOH  Cognition  Arousal/Alertness: Awake/alert Behavior During Therapy: WFL for tasks assessed/performed Overall Cognitive Status: Within Functional Limits for tasks assessed                                          General Comments      Exercises     Assessment/Plan    PT Assessment Patient needs continued PT services  PT Problem List Decreased strength;Decreased activity tolerance;Decreased balance;Decreased mobility;Decreased safety awareness;Decreased knowledge of use of DME       PT Treatment Interventions DME instruction;Therapeutic exercise;Gait training;Balance training;Stair training;Neuromuscular re-education;Functional mobility training;Therapeutic activities;Patient/family education    PT Goals (Current goals can be found in the Care Plan section)  Acute Rehab PT Goals Patient Stated Goal: to go home PT Goal Formulation: With patient Time For Goal Achievement: 09/15/21 Potential to Achieve Goals: Good    Frequency Min 2X/week     Co-evaluation               AM-PAC PT "6 Clicks" Mobility  Outcome Measure Help needed turning from your back to your side while in a flat bed without using bedrails?: None Help needed moving from lying on your back to sitting on the side of a flat bed without using bedrails?: A Little Help needed moving to and from a bed to a chair (including a wheelchair)?: A Little Help needed standing up from a chair using your arms (e.g., wheelchair or bedside chair)?: A Little Help needed to walk in hospital room?: A Little Help needed climbing 3-5 steps with a railing? : A Lot 6 Click Score: 18    End of Session Equipment Utilized During Treatment: Gait belt Activity Tolerance: Patient tolerated treatment well Patient left: in bed;with call bell/phone within reach;with bed alarm set Nurse Communication: Mobility status PT Visit Diagnosis: Repeated falls (R29.6);Muscle weakness (generalized) (M62.81);Other abnormalities of gait and  mobility (R26.89)    Time: 0940-1004 PT Time Calculation (min) (ACUTE ONLY): 24 min   Charges:              Glenice Laine MPH, SPT 09/02/21, 10:25 AM
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
## 2021-09-02 NOTE — Progress Notes (Signed)
Patient has a stress test this morning and has been NPO since midnight. Morning meds administered with a sip of water. Informed consent signed and placed in patient's chart.

## 2021-09-02 NOTE — Evaluation (Signed)
Occupational Therapy Evaluation Patient Details Name: Jason Lin MRN: [id number] DOB: 08/16/1940 Today's Date: 09/02/2021   History of Present Illness 81 years old male with PMH significant for dementia, BPH, major depression, hyperlipidemia, hypertension, Merkel cell carcinoma following up in oncology clinic presented to the ED with generalized weakness and ongoing chest pain.  Chest pain seems musculoskeletal associated with shortness of breath,  gets worse with exertion.  Workup reveals sinus bradycardia.   Clinical Impression   Jason Lin was seen for OT evaluation this date. Prior to hospital admission, pt was MOD I for mobility and ADLs, assist for meds/meals. Pt lives at [facility]. Pt currently requires SUPERVISION for ADL t/f no AD use - cues to ID fall risks (shoes untied, blankets on floor). Independent for all bed mobility, don/doff B shoes in sitting, and change pillow cases in sitting. Pt appears near baseline level of functioning, no acute OT needs identified, will sign off. Upon hospital discharge, recommend no OT follow up.    Recommendations for follow up therapy are one component of a multi-disciplinary discharge planning process, led by the attending physician.  Recommendations may be updated based on patient status, additional functional criteria and insurance authorization.   Follow Up Recommendations  No OT follow up    Assistance Recommended at Discharge Set up Supervision/Assistance  Patient can return home with the following A little help with walking and/or transfers;Direct supervision/assist for medications management;Direct supervision/assist for financial management    Functional Status Assessment  Patient has had a recent decline in their functional status and demonstrates the ability to make significant improvements in function in a reasonable and predictable amount of time.  Equipment Recommendations  None recommended by OT    Recommendations for  Other Services       Precautions / Restrictions Precautions Precautions: Fall Restrictions Weight Bearing Restrictions: No      Mobility Bed Mobility Overal bed mobility: Independent                  Transfers Overall transfer level: Needs assistance Equipment used: None Transfers: Sit to/from Stand Sit to Stand: Supervision                  Balance Overall balance assessment: History of Falls, Needs assistance Sitting-balance support: No upper extremity supported, Feet supported Sitting balance-Leahy Scale: Normal     Standing balance support: No upper extremity supported, During functional activity Standing balance-Leahy Scale: Fair                             ADL either performed or assessed with clinical judgement   ADL Overall ADL's : At baseline                                       General ADL Comments: SUPERVISION for ADL t/f no AD use - cues to ID fall risks (shoes untied, blankets on floor). Independent don B shoes in sitting and don/doff pillow cases in sitting.      Pertinent Vitals/Pain Pain Assessment Pain Assessment: No/denies pain     Hand Dominance     Extremity/Trunk Assessment Upper Extremity Assessment Upper Extremity Assessment: Overall WFL for tasks assessed   Lower Extremity Assessment Lower Extremity Assessment: Overall WFL for tasks assessed       Communication Communication Communication: HOH   Cognition Arousal/Alertness: Awake/alert Behavior  During Therapy: WFL for tasks assessed/performed Overall Cognitive Status: History of cognitive impairments - at baseline                                                  Home Living Family/patient expects to be discharged to:: Assisted living                             Home Equipment: Conservation officer, nature (2 wheels)   Additional Comments: [facility]      Prior Functioning/Environment Prior Level of  Function : Independent/Modified Independent;History of Falls (last six months)             Mobility Comments: mobile at facility using RW ADLs Comments: staff assists with meals/meds        OT Problem List: Decreased activity tolerance;Impaired balance (sitting and/or standing)         OT Goals(Current goals can be found in the care plan section) Acute Rehab OT Goals Patient Stated Goal: to get lunch OT Goal Formulation: With patient Time For Goal Achievement: 09/16/21 Potential to Achieve Goals: Good   AM-PAC OT "6 Clicks" Daily Activity     Outcome Measure Help from another person eating meals?: None Help from another person taking care of personal grooming?: A Little Help from another person toileting, which includes using toliet, bedpan, or urinal?: A Little Help from another person bathing (including washing, rinsing, drying)?: A Little Help from another person to put on and taking off regular upper body clothing?: None Help from another person to put on and taking off regular lower body clothing?: None 6 Click Score: 21   End of Session    Activity Tolerance: Patient tolerated treatment well Patient left: in bed;with call bell/phone within reach;with bed alarm set  OT Visit Diagnosis: Other abnormalities of gait and mobility (R26.89)                Time: 6629-4765 OT Time Calculation (min): 10 min Charges:  OT General Charges $OT Visit: 1 Visit OT Evaluation $OT Eval Low Complexity: 1 Low  Jason Lin, M.S. OTR/L  09/02/21, 2:40 PM  ascom [phone]

## 2021-09-02 NOTE — Progress Notes (Signed)
Patient has NS continuous going at 50 ml/hr

## 2021-09-03 DIAGNOSIS — R001 Bradycardia, unspecified: Secondary | ICD-10-CM | POA: Diagnosis not present

## 2021-09-03 LAB — GLUCOSE, CAPILLARY: Glucose-Capillary: 91 mg/dL (ref 70–99)

## 2021-09-03 MED ORDER — METHOCARBAMOL 500 MG PO TABS: 500.0000 mg | ORAL_TABLET | Freq: Three times a day (TID) | ORAL | 0 refills | Status: AC

## 2021-09-03 MED ORDER — LOPERAMIDE HCL 2 MG PO CAPS
4.0000 mg | ORAL_CAPSULE | ORAL | Status: DC | PRN
  Administered 2021-09-03 (×2): 4 mg via ORAL
  Filled 2021-09-03 (×2): qty 2

## 2021-09-03 MED ORDER — SENNOSIDES-DOCUSATE SODIUM 8.6-50 MG PO TABS: 2.0000 | ORAL_TABLET | Freq: Two times a day (BID) | ORAL | Status: DC | PRN

## 2021-09-03 MED ORDER — AMLODIPINE BESYLATE 5 MG PO TABS: 5.0000 mg | ORAL_TABLET | Freq: Every day | ORAL | 0 refills | Status: DC

## 2021-09-03 NOTE — Discharge Summary (Addendum)
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  Chest pain seems musculoskeletal associated with shortness of breath,  gets worse with exertion.  Workup reveals sinus bradycardia.  Troponin x 3 negative.  Cardiology is consulted,  awaiting recommendation. Patient is admitted for chest pain rule out ACS, found to have symptomatic bradycardia.   Negative lexiscan, however patient continues to endorse severe CP of unclear etiology.  CP persistent at time of dc.  Reproducible on exam.  Suspect MSK.  Recommend trial course of robaxin    Discharge Diagnoses:  Principal Problem:   Symptomatic bradycardia Active Problems:   Axillary lymphadenopathy   HTN (hypertension)   HLD (hyperlipidemia)   Depression   BPH (benign prostatic hyperplasia)   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HCC)   CAD (coronary artery disease)   Stage 3b chronic kidney disease (CKD) (HCC)   GERD (gastroesophageal reflux disease)  Atypical chest pain Unclear etiology Lexiscan negative ??MSK vs GI Plan: Robaxin 500 TID, 3 week course PRN APAP Avoid nitro    Symptomatic bradycardia Lin workup indicated per cardiology Lin indication for PPM Avoid BB, AV nodal blocking agents   Essential hypertension Suboptimal control Add amlodipine '5mg'$  daily  Open angle glaucoma Patient currently on timolol eye drops.  Consider optho  followup to discuss dc or changing this medication as it may contribute to bradycardia    Discharge Instructions  Discharge Instructions     Diet - low sodium heart healthy   Complete by: As directed    Increase activity slowly   Complete by: As directed       Allergies as of 09/03/2021       Reactions   Brimonidine Itching   Timolol Itching   Iodinated Contrast Media Other (See Comments)        Medication List     STOP taking these medications    fexofenadine 60 MG tablet Commonly known as: ALLEGRA   metoprolol succinate 25 MG 24 hr tablet Commonly known as: TOPROL-XL   polyethylene glycol 17 g packet Commonly known as: MIRALAX / GLYCOLAX       TAKE these medications    acetaminophen 500 MG tablet Commonly known as: TYLENOL Take 1,000 mg by mouth every 8 (eight) hours as needed.   Advair Diskus 100-50 MCG/ACT Aepb Generic drug: fluticasone-salmeterol Inhale 1 puff into the lungs 2 (two) times daily.   albuterol 108 (90 Base) MCG/ACT inhaler Commonly known as: VENTOLIN HFA Inhale 2 puffs into the lungs every 6 (six) hours as needed for wheezing or shortness of breath.   amLODipine 5 MG tablet Commonly known as: NORVASC Take 1 tablet (5 mg total) by mouth daily. Start taking on: September 04, 2021   Biofreeze 4 % Gel Generic drug: Menthol (Topical Analgesic) Apply 1 Application topically in the morning, at noon, and at bedtime.   bisacodyl 10 MG suppository Commonly known as: DULCOLAX Place 10 mg rectally every three (3) days as needed for moderate constipation.   calcium carbonate  500 MG chewable tablet Commonly known as: TUMS - dosed in mg elemental calcium Chew 1 tablet by mouth 2 (two) times daily.   cyanocobalamin 1000 MCG tablet Commonly known as: VITAMIN B12 Take 1,000 mcg by mouth daily.   dorzolamide-timolol 22.3-6.8 MG/ML ophthalmic solution Commonly known as: COSOPT Place 1 drop into both eyes every 12 (twelve) hours.   FLUoxetine  20 MG capsule Commonly known as: PROZAC Take 20 mg by mouth daily.   fluticasone 50 MCG/ACT nasal spray Commonly known as: FLONASE Place 2 sprays into both nostrils daily.   gabapentin 100 MG capsule Commonly known as: NEURONTIN Take 200 mg by mouth at bedtime.   guaiFENesin 100 MG/5ML liquid Commonly known as: ROBITUSSIN Take 10 mLs by mouth every 4 (four) hours as needed for cough or to loosen phlegm.   latanoprost 0.005 % ophthalmic solution Commonly known as: XALATAN 1 drop at bedtime.   lidocaine 4 % cream Commonly known as: LMX Apply 1 Application topically daily as needed.   loperamide 2 MG tablet Commonly known as: IMODIUM A-D Take 1 mg by mouth 2 (two) times daily as needed for diarrhea or loose stools.   loratadine 10 MG tablet Commonly known as: CLARITIN Take 10 mg by mouth daily.   medroxyPROGESTERone 5 MG tablet Commonly known as: PROVERA Take 5 mg by mouth in the morning and at bedtime.   melatonin 3 MG Tabs tablet Take 6 mg by mouth at bedtime.   methocarbamol 500 MG tablet Commonly known as: ROBAXIN Take 1 tablet (500 mg total) by mouth 3 (three) times daily for 21 days.   nitroGLYCERIN 0.4 MG SL tablet Commonly known as: NITROSTAT Place 0.4 mg under the tongue every 5 (five) minutes as needed for chest pain.   oxyCODONE 5 MG immediate release tablet Commonly known as: Oxy IR/ROXICODONE Take 2.5 mg by mouth every 6 (six) hours as needed for severe pain.   psyllium 0.52 g capsule Commonly known as: REGULOID Take 0.52 g by mouth daily.   senna-docusate 8.6-50 MG tablet Commonly known as: Senokot-S Take 2 tablets by mouth 2 (two) times daily as needed for mild constipation. What changed:  when to take this reasons to take this   simvastatin 20 MG tablet Commonly known as: ZOCOR Take 20 mg by mouth daily.   tamsulosin 0.4 MG Caps capsule Commonly known as: FLOMAX Take 0.4 mg by mouth daily after supper.   traZODone 100 MG  tablet Commonly known as: DESYREL Take 100 mg by mouth at bedtime.        Allergies  Allergen Reactions   Brimonidine Itching   Timolol Itching   Iodinated Contrast Media Other (See Comments)    Consultations: Cardiology   Procedures/Studies: ECHOCARDIOGRAM COMPLETE  Result Date: 09/01/2021    ECHOCARDIOGRAM REPORT   Patient Name:   Jason Lin Date of Exam: 09/01/2021 Medical Rec #:  [id number]      Height:       65.0 in Accession #:    [id number]     Weight:       161.0 lb Date of Birth:  January 09, 1941      BSA:          1.804 m Patient Age:    20 years       BP:           155/69 mmHg Patient Gender: M              HR:  43 bpm. Exam Location:  [facility] Procedure: 2D Echo, Color Doppler, Cardiac Doppler and Intracardiac            Opacification Agent Indications:     R07.9 Chest Pain  History:         Patient has Lin prior history of Echocardiogram examinations.                  Signs/Symptoms:Shortness of Breath and Fatigue; Risk                  Factors:Hypertension and Dyslipidemia.  Sonographer:     Charmayne Sheer Referring Phys:  [place] Diagnosing Phys: Yolonda Kida MD  Sonographer Comments: Technically difficult study due to poor echo windows. IMPRESSIONS  1. Left ventricular ejection fraction, by estimation, is 60 to 65%. The left ventricle has normal function. The left ventricle has Lin regional wall motion abnormalities. There is mild left ventricular hypertrophy. Left ventricular diastolic parameters were normal.  2. Right ventricular systolic function is normal. The right ventricular size is normal.  3. The mitral valve is normal in structure. Lin evidence of mitral valve regurgitation. Lin evidence of mitral stenosis.  4. The aortic valve is normal in structure. Aortic valve regurgitation is not visualized. Aortic valve sclerosis/calcification is present, without any evidence of aortic stenosis.  5. The inferior vena cava is normal in size with greater than 50%  respiratory variability, suggesting right atrial pressure of 3 mmHg. FINDINGS  Left Ventricle: Left ventricular ejection fraction, by estimation, is 60 to 65%. The left ventricle has normal function. The left ventricle has Lin regional wall motion abnormalities. Definity contrast agent was given IV to delineate the left ventricular  endocardial borders. The left ventricular internal cavity size was normal in size. There is mild left ventricular hypertrophy. Left ventricular diastolic parameters were normal. Right Ventricle: The right ventricular size is normal. Lin increase in right ventricular wall thickness. Right ventricular systolic function is normal. Left Atrium: Left atrial size was normal in size. Right Atrium: Right atrial size was normal in size. Pericardium: There is Lin evidence of pericardial effusion. Mitral Valve: The mitral valve is normal in structure. Lin evidence of mitral valve regurgitation. Lin evidence of mitral valve stenosis. Tricuspid Valve: The tricuspid valve is normal in structure. Tricuspid valve regurgitation is not demonstrated. Lin evidence of tricuspid stenosis. Aortic Valve: The aortic valve is normal in structure. Aortic valve regurgitation is not visualized. Aortic regurgitation PHT measures 728 msec. Aortic valve sclerosis/calcification is present, without any evidence of aortic stenosis. Aortic valve mean gradient measures 2.0 mmHg. Aortic valve peak gradient measures 3.4 mmHg. Aortic valve area, by VTI measures 2.94 cm. Pulmonic Valve: The pulmonic valve was normal in structure. Pulmonic valve regurgitation is not visualized. Lin evidence of pulmonic stenosis. Aorta: The aortic root is normal in size and structure. Venous: The inferior vena cava is normal in size with greater than 50% respiratory variability, suggesting right atrial pressure of 3 mmHg. IAS/Shunts: Lin atrial level shunt detected by color flow Doppler.  LEFT VENTRICLE PLAX 2D LVIDd:         5.20 cm      Diastology  LVIDs:         4.28 cm      LV e' medial:    7.51 cm/s LV PW:         1.03 cm      LV E/e' medial:  7.9 LV IVS:        0.80  cm      LV e' lateral:   8.16 cm/s LVOT diam:     2.10 cm      LV E/e' lateral: 7.3 LV SV:         62 LV SV Index:   34 LVOT Area:     3.46 cm  LV Volumes (MOD) LV vol d, MOD A2C: 122.0 ml LV vol d, MOD A4C: 119.0 ml LV vol s, MOD A2C: 52.2 ml LV vol s, MOD A4C: 51.3 ml LV SV MOD A2C:     69.8 ml LV SV MOD A4C:     119.0 ml LV SV MOD BP:      69.8 ml RIGHT VENTRICLE RV Basal diam:  4.46 cm LEFT ATRIUM             Index        RIGHT ATRIUM           Index LA diam:        3.70 cm 2.05 cm/m   RA Area:     22.40 cm LA Vol (A2C):   39.1 ml 21.67 ml/m  RA Volume:   70.10 ml  38.86 ml/m LA Vol (A4C):   36.0 ml 19.96 ml/m LA Biplane Vol: 38.5 ml 21.34 ml/m  AORTIC VALVE                    PULMONIC VALVE AV Area (Vmax):    3.35 cm     PV Vmax:       0.82 m/s AV Area (Vmean):   3.16 cm     PV Peak grad:  2.7 mmHg AV Area (VTI):     2.94 cm AV Vmax:           91.80 cm/s AV Vmean:          63.200 cm/s AV VTI:            0.210 m AV Peak Grad:      3.4 mmHg AV Mean Grad:      2.0 mmHg LVOT Vmax:         88.80 cm/s LVOT Vmean:        57.600 cm/s LVOT VTI:          0.178 m LVOT/AV VTI ratio: 0.85 AI PHT:            728 msec  AORTA Ao Root diam: 3.20 cm MITRAL VALVE MV Area (PHT): 2.96 cm    SHUNTS MV Decel Time: 256 msec    Systemic VTI:  0.18 m MV E velocity: 59.60 cm/s  Systemic Diam: 2.10 cm MV A velocity: 54.40 cm/s MV E/A ratio:  1.10 Dwayne Prince [place] MD Electronically signed by Yolonda Kida MD Signature Date/Time: 09/01/2021/1:46:16 PM    Final    DG Chest Portable 1 View  Result Date: 08/31/2021 CLINICAL DATA:  Weakness and chest pain EXAM: PORTABLE CHEST 1 VIEW COMPARISON:  08/06/2021 FINDINGS: The heart size and mediastinal contours are within normal limits. Both lungs are clear. The visualized skeletal structures are unremarkable. IMPRESSION: Lin active disease. Electronically Signed    By: Nelson Chimes M.D.   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 EXAM: NUCLEAR MEDICINE PET SKULL BASE TO THIGH TECHNIQUE: 8.28 mCi F-18 FDG was injected intravenously. Full-ring PET imaging was performed from the skull base to thigh after the radiotracer.  CT data was obtained and used for attenuation correction and anatomic localization. Fasting blood glucose: 106 mg/dl COMPARISON:  PET-CT March 03, 2021 and CT abdomen and pelvis July 20, 2021 FINDINGS: Mediastinal blood pool activity: SUV max 1.6 Liver activity: SUV max 2.6 NECK: Lin hypermetabolic cervical adenopathy. Low-level hypermetabolic activity in the thyroid gland may reflect thyroiditis. Incidental CT findings: Streak artifact from dental hardware. CHEST: Significant interval decrease in metabolic activity in the left axillary lymph nodes which now measure up to 18 x 6 mm on image 92/2 without residual abnormal hypermetabolic activity previously measuring 4.9 x 2.9 cm with a max SUV of 38.18 Lin hypermetabolic mediastinal, hilar or axillary adenopathy. Lin hypermetabolic pulmonary nodules or masses. Incidental CT findings: Aortic atherosclerosis. Coronary artery calcifications. Motion degraded examination reveals Lin suspicious pulmonary nodules or masses. ABDOMEN/PELVIS: Lin abnormal hypermetabolic activity within the liver, pancreas, adrenal glands, or spleen. Lin hypermetabolic lymph nodes in the abdomen or pelvis. Diffuse hypermetabolic colonic uptake is similar prior and commonly physiologic/medication-related (metformin). Incidental CT findings: Colonic diverticulosis. Dystrophic calcifications in an enlarged prostate gland. Aortic atherosclerosis. SKELETON: Lin focal hypermetabolic activity to suggest skeletal metastasis. Incidental CT findings: Multilevel degenerative changes spine with multifocal degenerative joint  disease. IMPRESSION: 1. Left axillary lymph nodes are significantly decreased in size and metabolic activity, now Lin longer pathologically enlarged by size criteria and are without abnormal FDG avidity. 2. Lin hypermetabolic adenopathy above or below the diaphragm. 3. Lin splenomegaly or evidence of osseous lymphomatous involvement. 4.  Aortic Atherosclerosis (ICD10-I70.0). Electronically Signed   By: Dahlia Bailiff M.D.   On: 08/18/2021 14:57   DG Chest 2 View  Result Date: 08/06/2021 CLINICAL DATA:  Chest pain EXAM: CHEST - 2 VIEW COMPARISON:  02/23/2021 FINDINGS: Normal size and vascularity. Lin focal pneumonia, collapse or consolidation. Right lower chest 8 mm well-circumscribed nodular density versus nodule. Alternatively this could represent a prominent nipple shadow. Consider repeat exam with nipple markers non emergently. Trachea midline. Degenerative changes of the spine. IMPRESSION: Lin acute chest finding. 8 mm right lower lung nodular density.  See above comment. Electronically Signed   By: Jerilynn Mages.  Shick M.D.   On: 08/06/2021 14:48      Subjective: Seen and examined on day of dc.  Stable, nad.  Continue to endorse CP  Discharge Exam: Vitals:   09/03/21 0343 09/03/21 0718  BP: 135/63 (!) 142/70  Pulse: (!) 49 (!) 50  Resp: 19 15  Temp: 97.6 F (36.4 C) 98 F (36.7 C)  SpO2: 95% 98%   Vitals:   09/02/21 1954 09/03/21 0011 09/03/21 0343 09/03/21 0718  BP: 125/62 134/64 135/63 (!) 142/70  Pulse: 61 62 (!) 49 (!) 50  Resp: '18 20 19 15  '$ Temp: 98 F (36.7 C) 97.8 F (36.6 C) 97.6 F (36.4 C) 98 F (36.7 C)  TempSrc: Oral Oral Oral   SpO2: 99% 97% 95% 98%  Weight:      Height:        General: Pt is alert, awake, not in acute distress Cardiovascular: RRR, S1/S2 +, Lin rubs, Lin gallops Respiratory: CTA bilaterally, Lin wheezing, Lin rhonchi Abdominal: Soft, NT, ND, bowel sounds + Extremities: Lin edema, Lin cyanosis    The results of significant diagnostics from this  hospitalization (including imaging, microbiology, ancillary and laboratory) are listed below for reference.     Microbiology: Lin results found for this or any previous visit (from the past 240 hour(s)).   Labs: BNP (last 3 results) Lin results for input(s): "BNP"  in the last 8760 hours. Basic Metabolic Panel: Recent Labs  Lab 08/31/21 1009 08/31/21 1350 09/01/21 0544 09/02/21 0523  NA 138 137 140 137  K 4.3 4.6 4.1 4.4  CL 107 111 113* 114*  CO2 26 20* 20* 20*  GLUCOSE 103* 92 98 146*  BUN 29* 30* 30* 31*  CREATININE 2.61* 2.52* 2.26* 2.23*  CALCIUM 8.9 8.8* 8.8* 8.8*  MG  --   --  2.1 2.0  PHOS  --   --  3.8 3.2   Liver Function Tests: Recent Labs  Lab 08/31/21 1009 08/31/21 1350 09/01/21 0544  AST 11* 14* 13*  ALT '12 11 11  '$ ALKPHOS 32* 33* 27*  BILITOT 1.1 1.2 1.5*  PROT 6.3* 6.2* 6.0*  ALBUMIN 3.6 3.6 3.3*   Lin results for input(s): "LIPASE", "AMYLASE" in the last 168 hours. Lin results for input(s): "AMMONIA" in the last 168 hours. CBC: Recent Labs  Lab 08/31/21 1009 08/31/21 1350 09/01/21 0544 09/02/21 0523  WBC 4.9 5.8 4.8 5.2  NEUTROABS 3.6 4.1  --   --   HGB 11.7* 11.9* 11.2* 11.5*  HCT 36.8* 37.6* 34.7* 35.0*  MCV 99.7 100.3* 97.5 95.1  PLT 150 151 142* 153   Cardiac Enzymes: Lin results for input(s): "CKTOTAL", "CKMB", "CKMBINDEX", "TROPONINI" in the last 168 hours. BNP: Invalid input(s): "POCBNP" CBG: Recent Labs  Lab 09/03/21 0719  GLUCAP 91   D-Dimer Recent Labs    08/31/21 1350  DDIMER 0.75*   Hgb A1c Lin results for input(s): "HGBA1C" in the last 72 hours. Lipid Profile Lin results for input(s): "CHOL", "HDL", "LDLCALC", "TRIG", "CHOLHDL", "LDLDIRECT" in the last 72 hours. Thyroid function studies Recent Labs    08/31/21 1823  TSH 3.720   Anemia work up Lin results for input(s): "VITAMINB12", "FOLATE", "FERRITIN", "TIBC", "IRON", "RETICCTPCT" in the last 72 hours. Urinalysis    Component Value Date/Time   COLORURINE  YELLOW (A) 07/20/2021 1125   APPEARANCEUR CLEAR (A) 07/20/2021 1125   LABSPEC 1.019 07/20/2021 1125   PHURINE 5.0 07/20/2021 1125   GLUCOSEU NEGATIVE 07/20/2021 1125   HGBUR NEGATIVE 07/20/2021 1125   BILIRUBINUR NEGATIVE 07/20/2021 1125   KETONESUR NEGATIVE 07/20/2021 1125   PROTEINUR 100 (A) 07/20/2021 1125   NITRITE NEGATIVE 07/20/2021 1125   LEUKOCYTESUR NEGATIVE 07/20/2021 1125   Sepsis Labs Recent Labs  Lab 08/31/21 1009 08/31/21 1350 09/01/21 0544 09/02/21 0523  WBC 4.9 5.8 4.8 5.2   Microbiology Lin results found for this or any previous visit (from the past 240 hour(s)).   Time coordinating discharge: Over 30 minutes  SIGNED:   Sidney Ace, MD  [facility] 09/03/2021, 10:17 AM Pager   If 7PM-7AM, please contact night-coverage

## 2021-09-03 NOTE — TOC Transition Note (Signed)
Transition of Care [facility]) - CM/SW Discharge Note   Patient Details  Name: Jason Lin MRN: [id number] Date of Birth: 1940-08-20  [facility] CM/SW Contact:  Alberteen Sam, LCSW Phone Number: 09/03/2021, 10:18 AM   Clinical Narrative:     Patient to dc home today back to [facility], they confirm they are able to accept patient back today and request fl2, [facility] orders and dc summary be faxed to [phone] with Attn: Otila Kluver.   RN to call report to [phone].   Patient to transport via taxi back to [facility].     Final next level of care: Assisted Living Barriers to Discharge: No Barriers Identified   Patient Goals and CMS Choice Patient states their goals for this hospitalization and ongoing recovery are:: to go home CMS Medicare.gov Compare Post Acute Care list provided to:: Patient Choice offered to / list presented to : Patient  Discharge Placement                       Discharge Plan and Services                                     Social Determinants of Health (SDOH) Interventions     Readmission Risk Interventions     No data to display

## 2021-09-03 NOTE — NC FL2 (Signed)
[place] LEVEL OF CARE SCREENING TOOL     IDENTIFICATION  Patient Name: Jason Lin Birthdate: 02-26-1940 Sex: male Admission Date (Current Location): 08/31/2021  [facility] and [place] Number:  Engineering geologist and Address:  [facility], [address], [place], [place] [address]      Provider Number: [id number]  Attending Physician Name and Address:  Sidney Ace, MD  Relative Name and Phone Number:  Laveda Abbe [phone]    Current Level of Care: Hospital Recommended Level of Care: [facility]) Prior Approval Number:    Date Approved/Denied:   PASRR Number:    Discharge Plan: Other (Comment) [facility])    Current Diagnoses: Patient Active Problem List   Diagnosis Date Noted   Symptomatic bradycardia 08/31/2021   Scrotal pain    Acute renal failure superimposed on chronic kidney disease ([place]) 07/21/2021   GERD (gastroesophageal reflux disease) 03/09/2021   Hx of echocardiogram 03/09/2021   Goals of care, counseling/discussion 03/09/2021   CAD (coronary artery disease) 02/25/2021   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   Stage 3b chronic kidney disease (CKD) ([place]) 02/25/2021   Chest pain 02/23/2021   Closed fracture sternum 02/23/2021   Axillary lymphadenopathy 02/23/2021   HTN (hypertension) 02/23/2021   Acute renal failure superimposed on stage 4 chronic kidney disease ([place]) 02/23/2021   Hypertension 02/23/2021   HLD (hyperlipidemia) 02/23/2021   Depression 02/23/2021   BPH (benign prostatic hyperplasia) 02/23/2021   Positive D-dimer    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[place])    Constipation 08/22/2020   B12 deficiency 08/07/2020   CKD (chronic kidney disease) stage 4, GFR 15-29 ml/min (HCC) 08/07/2020   Closed nondisplaced fracture of medial cuneiform of right foot 08/07/2020   Compression fx, thoracic spine, closed, initial encounter ([place]) 08/07/2020   Altered mental status 07/31/2020     Orientation RESPIRATION BLADDER Height & Weight     Self, Situation, Time, Place  Normal Continent Weight: 161 lb (73 kg) Height:  '5\' 5"'$  (165.1 cm)  BEHAVIORAL SYMPTOMS/MOOD NEUROLOGICAL BOWEL NUTRITION STATUS      Continent Diet Heart Healthy  AMBULATORY STATUS COMMUNICATION OF NEEDS Skin   Limited Assist Verbally Normal                       Personal Care Assistance Level of Assistance  Bathing, Feeding, Dressing, Total care Bathing Assistance: Limited assistance Feeding assistance: Independent Dressing Assistance: Limited assistance Total Care Assistance: Limited assistance   Functional Limitations Info  Sight, Speech, Hearing Sight Info: Adequate Hearing Info: Adequate Speech Info: Adequate    SPECIAL CARE FACTORS FREQUENCY   (Home health PT recommendations, HH orders faxed.)                    Contractures Contractures Info: Not present    Additional Factors Info  Code Status, Allergies Code Status Info: full Allergies Info: Brimonidine   Timolol   Iodinated Contrast Media           Discharge Medications: acetaminophen 500 MG tablet Commonly known as: TYLENOL Take 1,000 mg by mouth every 8 (eight) hours as needed.    Advair Diskus 100-50 MCG/ACT Aepb Generic drug: fluticasone-salmeterol Inhale 1 puff into the lungs 2 (two) times daily.    albuterol 108 (90 Base) MCG/ACT inhaler Commonly known as: VENTOLIN HFA Inhale 2 puffs into the lungs every 6 (six) hours as needed for wheezing or shortness of breath.    amLODipine 5 MG tablet Commonly  known as: NORVASC Take 1 tablet (5 mg total) by mouth daily. Start taking on: September 04, 2021    Biofreeze 4 % Gel Generic drug: Menthol (Topical Analgesic) Apply 1 Application topically in the morning, at noon, and at bedtime.    bisacodyl 10 MG suppository Commonly known as: DULCOLAX Place 10 mg rectally every three (3) days as needed for moderate constipation.    calcium carbonate 500 MG  chewable tablet Commonly known as: TUMS - dosed in mg elemental calcium Chew 1 tablet by mouth 2 (two) times daily.    cyanocobalamin 1000 MCG tablet Commonly known as: VITAMIN B12 Take 1,000 mcg by mouth daily.    dorzolamide-timolol 22.3-6.8 MG/ML ophthalmic solution Commonly known as: COSOPT Place 1 drop into both eyes every 12 (twelve) hours.    FLUoxetine 20 MG capsule Commonly known as: PROZAC Take 20 mg by mouth daily.    fluticasone 50 MCG/ACT nasal spray Commonly known as: FLONASE Place 2 sprays into both nostrils daily.    gabapentin 100 MG capsule Commonly known as: NEURONTIN Take 200 mg by mouth at bedtime.    guaiFENesin 100 MG/5ML liquid Commonly known as: ROBITUSSIN Take 10 mLs by mouth every 4 (four) hours as needed for cough or to loosen phlegm.    latanoprost 0.005 % ophthalmic solution Commonly known as: XALATAN 1 drop at bedtime.    lidocaine 4 % cream Commonly known as: LMX Apply 1 Application topically daily as needed.    loperamide 2 MG tablet Commonly known as: IMODIUM A-D Take 1 mg by mouth 2 (two) times daily as needed for diarrhea or loose stools.    loratadine 10 MG tablet Commonly known as: CLARITIN Take 10 mg by mouth daily.    medroxyPROGESTERone 5 MG tablet Commonly known as: PROVERA Take 5 mg by mouth in the morning and at bedtime.    melatonin 3 MG Tabs tablet Take 6 mg by mouth at bedtime.    methocarbamol 500 MG tablet Commonly known as: ROBAXIN Take 1 tablet (500 mg total) by mouth 3 (three) times daily for 21 days.    nitroGLYCERIN 0.4 MG SL tablet Commonly known as: NITROSTAT Place 0.4 mg under the tongue every 5 (five) minutes as needed for chest pain.    oxyCODONE 5 MG immediate release tablet Commonly known as: Oxy IR/ROXICODONE Take 2.5 mg by mouth every 6 (six) hours as needed for severe pain.    psyllium 0.52 g capsule Commonly known as: REGULOID Take 0.52 g by mouth daily.    senna-docusate 8.6-50 MG  tablet Commonly known as: Senokot-S Take 2 tablets by mouth 2 (two) times daily as needed for mild constipation. What changed:  when to take this reasons to take this    simvastatin 20 MG tablet Commonly known as: ZOCOR Take 20 mg by mouth daily.    tamsulosin 0.4 MG Caps capsule Commonly known as: FLOMAX Take 0.4 mg by mouth daily after supper.    traZODone 100 MG tablet Commonly known as: DESYREL Take 100 mg by mouth at bedtime.           Relevant Imaging Results:  Relevant Lab Results:   Additional Information ZMO:[id number]  Alberteen Sam, LCSW

## 2021-09-17 LAB — NM MYOCAR MULTI W/SPECT W/WALL MOTION / EF
Base ST Depression (mm): 0 mm
Estimated workload: 1
Exercise duration (min): 1 min
Exercise duration (sec): 4 s
LV dias vol: 74 mL (ref 62–150)
LV sys vol: 21 mL
MPHR: 139 {beats}/min
Nuc Stress EF: 72 %
Peak HR: 104 {beats}/min
Percent HR: 74 %
Rest HR: 55 {beats}/min
Rest Nuclear Isotope Dose: 10.6 mCi
SDS: 0
SRS: 7
SSS: 2
ST Depression (mm): 0 mm
Stress Nuclear Isotope Dose: 33 mCi
TID: 0.76

## 2021-09-22 ENCOUNTER — Telehealth: Payer: Self-pay | Admitting: *Deleted

## 2021-09-22 NOTE — Telephone Encounter (Signed)
I have attempted to call [facility] multiple times again and still unable to get through the phone system , I have sent a message via the [place] system to Dr Steward Drone with Dr Collie Siad cell number

## 2021-09-22 NOTE — Telephone Encounter (Signed)
Dr Steward Drone from [facility] called asking to have Dr Tasia Catchings return her call yesterday. She said it was not emergent, but she wanted a return call. I sent a message to Dr Tasia Catchings and she attempted to call back, but when she did, the office was closed, so she asked that I give her cell number to Dr Steward Drone to call her. I have attempted 5 times to get through to [facility] clinic office but the phone lines do not seem to be working correctly giving a message that it does not recognize my response when I push 3 as it said to do for what I needed. I even tried the office at [facility] with same response. I will try again later this morning

## 2021-10-19 ENCOUNTER — Ambulatory Visit: Payer: Medicare PPO | Admitting: Podiatry

## 2021-10-26 ENCOUNTER — Ambulatory Visit: Payer: Medicare PPO | Admitting: Oncology

## 2021-11-02 ENCOUNTER — Emergency Department: Payer: Medicare PPO

## 2021-11-02 ENCOUNTER — Emergency Department
Admission: EM | Admit: 2021-11-02 | Discharge: 2021-11-02 | Disposition: A | Discharge status: Home / Self Care | Payer: Medicare PPO | Attending: Emergency Medicine | Admitting: Emergency Medicine

## 2021-11-02 ENCOUNTER — Other Ambulatory Visit: Payer: Self-pay

## 2021-11-02 DIAGNOSIS — I10 Essential (primary) hypertension: Secondary | ICD-10-CM | POA: Diagnosis not present

## 2021-11-02 DIAGNOSIS — J441 Chronic obstructive pulmonary disease with (acute) exacerbation: Secondary | ICD-10-CM | POA: Diagnosis not present

## 2021-11-02 DIAGNOSIS — Z1152 Encounter for screening for COVID-19: Secondary | ICD-10-CM | POA: Insufficient documentation

## 2021-11-02 DIAGNOSIS — R109 Unspecified abdominal pain: Secondary | ICD-10-CM | POA: Insufficient documentation

## 2021-11-02 DIAGNOSIS — F0390 Unspecified dementia without behavioral disturbance: Secondary | ICD-10-CM | POA: Diagnosis not present

## 2021-11-02 DIAGNOSIS — R0602 Shortness of breath: Secondary | ICD-10-CM | POA: Diagnosis present

## 2021-11-02 LAB — BASIC METABOLIC PANEL
Anion gap: 7 (ref 5–15)
BUN: 42 mg/dL — ABNORMAL HIGH (ref 8–23)
CO2: 16 mmol/L — ABNORMAL LOW (ref 22–32)
Calcium: 9.5 mg/dL (ref 8.9–10.3)
Chloride: 118 mmol/L — ABNORMAL HIGH (ref 98–111)
Creatinine, Ser: 2.99 mg/dL — ABNORMAL HIGH (ref 0.61–1.24)
GFR, Estimated: 20 mL/min — ABNORMAL LOW (ref >60)
Glucose, Bld: 142 mg/dL — ABNORMAL HIGH (ref 70–99)
Potassium: 3.4 mmol/L — ABNORMAL LOW (ref 3.5–5.1)
Sodium: 141 mmol/L (ref 135–145)

## 2021-11-02 LAB — CBC
HCT: 41.2 % (ref 39.0–52.0)
Hemoglobin: 13.2 g/dL (ref 13.0–17.0)
MCH: 31.4 pg (ref 26.0–34.0)
MCHC: 32 g/dL (ref 30.0–36.0)
MCV: 98.1 fL (ref 80.0–100.0)
Platelets: 158 10*3/uL (ref 150–400)
RBC: 4.2 MIL/uL — ABNORMAL LOW (ref 4.22–5.81)
RDW: 12.9 % (ref 11.5–15.5)
WBC: 7 10*3/uL (ref 4.0–10.5)
nRBC: 0 % (ref 0.0–0.2)

## 2021-11-02 LAB — HEPATIC FUNCTION PANEL
ALT: 10 U/L (ref 0–44)
AST: 13 U/L — ABNORMAL LOW (ref 15–41)
Albumin: 4 g/dL (ref 3.5–5.0)
Alkaline Phosphatase: 34 U/L — ABNORMAL LOW (ref 38–126)
Bilirubin, Direct: 0.2 mg/dL (ref 0.0–0.2)
Indirect Bilirubin: 1.2 mg/dL — ABNORMAL HIGH (ref 0.3–0.9)
Total Bilirubin: 1.4 mg/dL — ABNORMAL HIGH (ref 0.3–1.2)
Total Protein: 6.9 g/dL (ref 6.5–8.1)

## 2021-11-02 LAB — LIPASE, BLOOD: Lipase: 55 U/L — ABNORMAL HIGH (ref 11–51)

## 2021-11-02 LAB — SARS CORONAVIRUS 2 BY RT PCR: SARS Coronavirus 2 by RT PCR: NEGATIVE

## 2021-11-02 LAB — TROPONIN I (HIGH SENSITIVITY)
Troponin I (High Sensitivity): 14 ng/L (ref <18)
Troponin I (High Sensitivity): 12 ng/L (ref <18)

## 2021-11-02 MED ORDER — PREDNISONE 20 MG PO TABS
60.0000 mg | ORAL_TABLET | Freq: Once | ORAL | Status: AC
  Administered 2021-11-02: 60 mg via ORAL

## 2021-11-02 MED ORDER — ACETAMINOPHEN 500 MG PO TABS
1000.0000 mg | ORAL_TABLET | Freq: Once | ORAL | Status: AC
  Administered 2021-11-02: 1000 mg via ORAL

## 2021-11-02 MED ORDER — PREDNISONE 50 MG PO TABS: 50.0000 mg | ORAL_TABLET | Freq: Every day | ORAL | 0 refills | Status: DC

## 2021-11-02 MED ORDER — PREDNISONE 50 MG PO TABS: 50.0000 mg | ORAL_TABLET | Freq: Every day | ORAL | 0 refills | Status: AC

## 2021-11-02 NOTE — ED Triage Notes (Signed)
FIRST NURSE NOTE:  Pt arrived via [facility] from [facility], EMS reports they were called out for breathing difficulties, per EMS [facility] reported giving 2 neb treatments, EMS reports lungs clear, VSS with ET of less than 20.  EKG showed PVCs  Per EMS the facility reported the patient vomited earlier today.

## 2021-11-02 NOTE — ED Provider Notes (Signed)
[facility] Provider Note    Event Date/Time   First MD Initiated Contact with Patient 11/02/21 [phone]     (approximate)   History   Shortness of Breath   HPI  Jason Lin is a 81 y.o. male who presents to the ED for evaluation of Shortness of Breath   I review 8/10 medical DC summary where patient was admitted for symptomatic bradycardia.  He has a history of dementia, BPH, depression, HLD and HTN.  Sinus bradycardia without high-grade block.  Negative Lexiscan, reproducible chest pain suspected to be an MSK etiology.  Presents to the ED from his SNF for evaluation of shortness of breath.  Reportedly got multiple DuoNebs prehospital and EMS reports he was doing better after this.  Here in the ED, he reports having abdominal pain and being in the hospital for 30 years.  Denies chest pain or shortness of breath.  Physical Exam   Triage Vital Signs: ED Triage Vitals  Enc Vitals Group     BP 11/02/21 0246 (!) 103/56     Pulse Rate 11/02/21 0246 83     Resp 11/02/21 0246 20     Temp 11/02/21 0246 98 F (36.7 C)     Temp Source 11/02/21 0246 Oral     SpO2 11/02/21 0246 93 %     Weight 11/02/21 0240 165 lb (74.8 kg)     Height --      Head Circumference --      Peak Flow --      Pain Score 11/02/21 0240 10     Pain Loc --      Pain Edu? --      Excl. in [place]? --     Most recent vital signs: Vitals:   11/02/21 0246 11/02/21 0520  BP: (!) 103/56 (!) 152/77  Pulse: 83 62  Resp: 20 18  Temp: 98 F (36.7 C)   SpO2: 93% 100%    General: Awake, no distress.  Pleasantly disoriented CV:  Good peripheral perfusion.  Resp:  Normal effort.  Faint end expiratory wheezes scattered throughout without decreased airflow. Abd:  No distention.  No appreciable tenderness or peritoneal features MSK:  No deformity noted.  Neuro:  No focal deficits appreciated. Other:     ED Results / Procedures / Treatments   Labs (all labs ordered are listed, but only  abnormal results are displayed) Labs Reviewed  BASIC METABOLIC PANEL - Abnormal; Notable for the following components:      Result Value   Potassium 3.4 (*)    Chloride 118 (*)    CO2 16 (*)    Glucose, Bld 142 (*)    BUN 42 (*)    Creatinine, Ser 2.99 (*)    GFR, Estimated 20 (*)    All other components within normal limits  CBC - Abnormal; Notable for the following components:   RBC 4.20 (*)    All other components within normal limits  LIPASE, BLOOD - Abnormal; Notable for the following components:   Lipase 55 (*)    All other components within normal limits  HEPATIC FUNCTION PANEL - Abnormal; Notable for the following components:   AST 13 (*)    Alkaline Phosphatase 34 (*)    Total Bilirubin 1.4 (*)    Indirect Bilirubin 1.2 (*)    All other components within normal limits  SARS CORONAVIRUS 2 BY RT PCR  TROPONIN I (HIGH SENSITIVITY)  TROPONIN I (HIGH SENSITIVITY)    EKG  Sinus rhythm with a rate of 80bpm, normal axis . x3 PVCs. No clear signs of acute ischemia.   RADIOLOGY  CXR interpreted by me without evidence of acute cardiopulmonary pathology.  Official radiology report(s): CT ABDOMEN PELVIS WO CONTRAST  Result Date: 11/02/2021 CLINICAL DATA:  Abdominal pain and emesis. EXAM: CT ABDOMEN AND PELVIS WITHOUT CONTRAST TECHNIQUE: Multidetector CT imaging of the abdomen and pelvis was performed following the standard protocol without IV contrast. RADIATION DOSE REDUCTION: This exam was performed according to the departmental dose-optimization program which includes automated exposure control, adjustment of the mA and/or kV according to patient size and/or use of iterative reconstruction technique. COMPARISON:  PETCT 08/18/2021 FINDINGS: Lower chest:  Coronary atherosclerosis. Hepatobiliary: No focal liver abnormality.No evidence of biliary obstruction or stone. Pancreas: Unremarkable. Spleen: Unremarkable. Adrenals/Urinary Tract: Negative adrenals. No hydronephrosis or stone.  Right interpolar cystic density, no specific follow-up recommended. Unremarkable bladder. Stomach/Bowel: No obstruction. Colonic diverticulosis. No evidence of bowel inflammation. Vascular/Lymphatic: No acute vascular abnormality. Diffuse atheromatous calcification of the aorta and branch vessels. No mass or adenopathy. Reproductive:No pathologic findings. Other: No ascites or pneumoperitoneum. Musculoskeletal: No acute abnormalities. Scoliosis and advanced lumbar spine degeneration with multilevel lumbar foraminal impingement. IMPRESSION: 1. No acute finding. 2. Atherosclerosis, including the coronary arteries. 3. Colonic diverticulosis. Electronically Signed   By: Jorje Guild M.D.   On: 11/02/2021 06:03   DG Chest 2 View  Result Date: 11/02/2021 CLINICAL DATA:  Shortness of breath EXAM: CHEST - 2 VIEW COMPARISON:  08/31/2021 FINDINGS: Shallow inspiration. Mild cardiac enlargement. Lungs are clear. No pleural effusions. No pneumothorax. Mediastinal contours appear intact. IMPRESSION: Cardiac enlargement.  No evidence of active pulmonary disease. Electronically Signed   By: Lucienne Capers M.D.   On: 11/02/2021 03:20    PROCEDURES and INTERVENTIONS:  .1-3 Lead EKG Interpretation  Performed by: Vladimir Crofts, MD Authorized by: Vladimir Crofts, MD     Interpretation: normal     ECG rate:  64   ECG rate assessment: normal     Rhythm: sinus rhythm     Ectopy: none     Conduction: normal     Medications  predniSONE (DELTASONE) tablet 60 mg (has no administration in time range)  acetaminophen (TYLENOL) tablet 1,000 mg (has no administration in time range)     IMPRESSION / MDM / ASSESSMENT AND PLAN / ED COURSE  I reviewed the triage vital signs and the nursing notes.  Differential diagnosis includes, but is not limited to, COPD exacerbation, pneumonia, ACS, sepsis, pneumothorax  {Patient presents with symptoms of an acute illness or injury that is potentially life-threatening.  81yo male  presents to the ED for evaluation of SOB, with signs of a mild COPD exacerbation suitable for outpatient management. No hypoxia or distress. Mild wheezing on exam. CXR is clear and blood work is generally reassuring. CKD around baseline, CBC without leukocytosis.  2 negative troponins and reassuring hepatic function panel and lipase.  Testing negative for COVID.  CT abdomen/pelvis obtained without evidence of acute intra-abdominal pathology.  Overall he looks well and has no complaints, requesting food.  I believe outpatient management with a course of steroids to treat his COPD is reasonable.  We will discharge back to his SNF.      FINAL CLINICAL IMPRESSION(S) / ED DIAGNOSES   Final diagnoses:  COPD exacerbation ([place])  Shortness of breath     Rx / DC Orders   ED Discharge Orders          Ordered  predniSONE (DELTASONE) 50 MG tablet  Daily        11/02/21 0636             Note:  This document was prepared using Dragon voice recognition software and may include unintentional dictation errors.   Vladimir Crofts, MD 11/02/21 [phone]

## 2021-11-02 NOTE — Discharge Instructions (Signed)
We gave Jason Lin prednisone steroids here in the ED this morning, he was discharged with a prescription for 4 more days to start on Tuesday 10/10.  Once per day for the rest of this week to finish a 5-day course.

## 2021-11-23 ENCOUNTER — Telehealth: Payer: Self-pay | Admitting: *Deleted

## 2021-11-23 NOTE — Telephone Encounter (Signed)
Per Dr Tasia Catchings, move appointment up to her next available appointment. Notify patient POA Laveda Abbe

## 2021-11-23 NOTE — Telephone Encounter (Signed)
Jason Lin, patient brother who has POA called stating that patient is not doing good and he does feel that the radiation therapy killed patient caner. He has lost 20# and is not eating. He does not want to get out of bed and he looks like he is going to die if something is not done for him,. He said that there is no way patient can wait until February to be seen "he will die before then" Please advise. Patient resides at [facility] and brother does not feel he is getting good care there.

## 2021-11-23 NOTE — Telephone Encounter (Signed)
Call returned to [facility] and advised per Dr Tasia Catchings that she wants patient taken to ER.  He said that ER does not do anything for him but keep him for 12 hours and send him back to facility he has been there several times over the past several months, so why does he need to go there. He said they did not even give him IV fluids last time he was there.

## 2021-11-25 NOTE — Telephone Encounter (Signed)
Spoke to [facility]) , who confirmed that pt is not on hospice. Facility recommended hospice but he decided not to proceed with this. He confirmed appt with Dr. Tasia Catchings on 11/13.   Please cancel lab on 11/10 Add lab on 11/13 @ 10:15. Cecil aware of time change for appt.

## 2021-12-03 ENCOUNTER — Other Ambulatory Visit: Payer: Self-pay | Admitting: Nephrology

## 2021-12-03 DIAGNOSIS — I129 Hypertensive chronic kidney disease with stage 1 through stage 4 chronic kidney disease, or unspecified chronic kidney disease: Secondary | ICD-10-CM

## 2021-12-03 DIAGNOSIS — N184 Chronic kidney disease, stage 4 (severe): Secondary | ICD-10-CM

## 2021-12-04 ENCOUNTER — Other Ambulatory Visit: Payer: Medicare PPO

## 2021-12-04 ENCOUNTER — Inpatient Hospital Stay (HOSPITAL_BASED_OUTPATIENT_CLINIC_OR_DEPARTMENT_OTHER): Payer: Medicare PPO | Admitting: Oncology

## 2021-12-04 ENCOUNTER — Encounter: Payer: Self-pay | Admitting: Oncology

## 2021-12-04 ENCOUNTER — Telehealth: Payer: Self-pay | Admitting: Oncology

## 2021-12-04 ENCOUNTER — Inpatient Hospital Stay: Payer: Medicare PPO | Attending: Oncology

## 2021-12-04 VITALS — BP 107/60 | Temp 97.9°F | Resp 18 | Wt 149.3 lb

## 2021-12-04 DIAGNOSIS — N1832 Chronic kidney disease, stage 3b: Secondary | ICD-10-CM

## 2021-12-04 DIAGNOSIS — Z8249 Family history of ischemic heart disease and other diseases of the circulatory system: Secondary | ICD-10-CM | POA: Insufficient documentation

## 2021-12-04 DIAGNOSIS — F32A Depression, unspecified: Secondary | ICD-10-CM | POA: Insufficient documentation

## 2021-12-04 DIAGNOSIS — Z818 Family history of other mental and behavioral disorders: Secondary | ICD-10-CM | POA: Insufficient documentation

## 2021-12-04 DIAGNOSIS — Z91041 Radiographic dye allergy status: Secondary | ICD-10-CM | POA: Insufficient documentation

## 2021-12-04 DIAGNOSIS — R634 Abnormal weight loss: Secondary | ICD-10-CM | POA: Insufficient documentation

## 2021-12-04 DIAGNOSIS — E785 Hyperlipidemia, unspecified: Secondary | ICD-10-CM | POA: Insufficient documentation

## 2021-12-04 DIAGNOSIS — Z85820 Personal history of malignant melanoma of skin: Secondary | ICD-10-CM | POA: Insufficient documentation

## 2021-12-04 DIAGNOSIS — Z923 Personal history of irradiation: Secondary | ICD-10-CM | POA: Diagnosis not present

## 2021-12-04 DIAGNOSIS — R0602 Shortness of breath: Secondary | ICD-10-CM | POA: Diagnosis not present

## 2021-12-04 DIAGNOSIS — C4A9 Merkel cell carcinoma, unspecified: Secondary | ICD-10-CM | POA: Insufficient documentation

## 2021-12-04 DIAGNOSIS — C7B01 Secondary carcinoid tumors of distant lymph nodes: Secondary | ICD-10-CM | POA: Insufficient documentation

## 2021-12-04 DIAGNOSIS — F0393 Unspecified dementia, unspecified severity, with mood disturbance: Secondary | ICD-10-CM | POA: Diagnosis not present

## 2021-12-04 DIAGNOSIS — E876 Hypokalemia: Secondary | ICD-10-CM | POA: Diagnosis not present

## 2021-12-04 DIAGNOSIS — Z79899 Other long term (current) drug therapy: Secondary | ICD-10-CM | POA: Diagnosis not present

## 2021-12-04 LAB — COMPREHENSIVE METABOLIC PANEL
ALT: 11 U/L (ref 0–44)
AST: 14 U/L — ABNORMAL LOW (ref 15–41)
Albumin: 3.1 g/dL — ABNORMAL LOW (ref 3.5–5.0)
Alkaline Phosphatase: 37 U/L — ABNORMAL LOW (ref 38–126)
Anion gap: 6 (ref 5–15)
BUN: 22 mg/dL (ref 8–23)
CO2: 21 mmol/L — ABNORMAL LOW (ref 22–32)
Calcium: 8.3 mg/dL — ABNORMAL LOW (ref 8.9–10.3)
Chloride: 110 mmol/L (ref 98–111)
Creatinine, Ser: 2.15 mg/dL — ABNORMAL HIGH (ref 0.61–1.24)
GFR, Estimated: 30 mL/min — ABNORMAL LOW (ref >60)
Glucose, Bld: 129 mg/dL — ABNORMAL HIGH (ref 70–99)
Potassium: 3 mmol/L — ABNORMAL LOW (ref 3.5–5.1)
Sodium: 137 mmol/L (ref 135–145)
Total Bilirubin: 0.6 mg/dL (ref 0.3–1.2)
Total Protein: 5.5 g/dL — ABNORMAL LOW (ref 6.5–8.1)

## 2021-12-04 LAB — CBC WITH DIFFERENTIAL/PLATELET
Abs Immature Granulocytes: 0.06 10*3/uL (ref 0.00–0.07)
Basophils Absolute: 0 10*3/uL (ref 0.0–0.1)
Basophils Relative: 1 %
Eosinophils Absolute: 0.3 10*3/uL (ref 0.0–0.5)
Eosinophils Relative: 6 %
HCT: 35.2 % — ABNORMAL LOW (ref 39.0–52.0)
Hemoglobin: 11.4 g/dL — ABNORMAL LOW (ref 13.0–17.0)
Immature Granulocytes: 1 %
Lymphocytes Relative: 9 %
Lymphs Abs: 0.5 10*3/uL — ABNORMAL LOW (ref 0.7–4.0)
MCH: 31.8 pg (ref 26.0–34.0)
MCHC: 32.4 g/dL (ref 30.0–36.0)
MCV: 98.3 fL (ref 80.0–100.0)
Monocytes Absolute: 0.6 10*3/uL (ref 0.1–1.0)
Monocytes Relative: 10 %
Neutro Abs: 4.1 10*3/uL (ref 1.7–7.7)
Neutrophils Relative %: 73 %
Platelets: 156 10*3/uL (ref 150–400)
RBC: 3.58 MIL/uL — ABNORMAL LOW (ref 4.22–5.81)
RDW: 15.4 % (ref 11.5–15.5)
WBC: 5.5 10*3/uL (ref 4.0–10.5)
nRBC: 0 % (ref 0.0–0.2)

## 2021-12-04 NOTE — Assessment & Plan Note (Signed)
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  Status post radiation  Recent CT abdomen pelvis wo showed no signs of recurrence.  I will check CT chest wo  for further evaluation.

## 2021-12-04 NOTE — Assessment & Plan Note (Signed)
I do not recommend aggressive K supplementation due to his CKD.  Recommend to increase K rich food intake. Order sent to his facility to add 1 banana to his meal once daily for the next 3 weeks.

## 2021-12-04 NOTE — Assessment & Plan Note (Signed)
Avoid nephrotoxins
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
## 2021-12-04 NOTE — Assessment & Plan Note (Signed)
Etiology unknown. Possible failure to thrive or nutrition deficit

## 2021-12-04 NOTE — Progress Notes (Signed)
Pt here for follow up.

## 2021-12-04 NOTE — Telephone Encounter (Signed)
pt caretaker notified of r/s of lab encounter

## 2021-12-07 ENCOUNTER — Inpatient Hospital Stay: Payer: Medicare PPO | Admitting: Oncology

## 2021-12-07 ENCOUNTER — Other Ambulatory Visit: Payer: Medicare PPO

## 2021-12-14 ENCOUNTER — Ambulatory Visit
Admission: RE | Admit: 2021-12-14 | Discharge: 2021-12-14 | Disposition: A | Discharge status: Home / Self Care | Payer: Medicare PPO | Source: Ambulatory Visit | Attending: Oncology | Admitting: Oncology

## 2021-12-14 ENCOUNTER — Ambulatory Visit
Admission: RE | Admit: 2021-12-14 | Discharge: 2021-12-14 | Disposition: A | Discharge status: Home / Self Care | Payer: Medicare PPO | Source: Ambulatory Visit | Attending: Nephrology | Admitting: Nephrology

## 2021-12-14 DIAGNOSIS — I129 Hypertensive chronic kidney disease with stage 1 through stage 4 chronic kidney disease, or unspecified chronic kidney disease: Secondary | ICD-10-CM | POA: Insufficient documentation

## 2021-12-14 DIAGNOSIS — I2510 Atherosclerotic heart disease of native coronary artery without angina pectoris: Secondary | ICD-10-CM | POA: Diagnosis not present

## 2021-12-14 DIAGNOSIS — K8020 Calculus of gallbladder without cholecystitis without obstruction: Secondary | ICD-10-CM | POA: Insufficient documentation

## 2021-12-14 DIAGNOSIS — N184 Chronic kidney disease, stage 4 (severe): Secondary | ICD-10-CM

## 2021-12-14 DIAGNOSIS — I700 Atherosclerosis of aorta: Secondary | ICD-10-CM | POA: Insufficient documentation

## 2021-12-14 DIAGNOSIS — C4A9 Merkel cell carcinoma, unspecified: Secondary | ICD-10-CM | POA: Diagnosis present

## 2021-12-15 ENCOUNTER — Other Ambulatory Visit: Payer: Self-pay

## 2021-12-15 ENCOUNTER — Telehealth: Payer: Self-pay | Admitting: *Deleted

## 2021-12-15 NOTE — Telephone Encounter (Signed)
Called and informed Jason Lin [facility]) of MD recommendation for PET, for furhter evaluation of celiac lymph node enlargement.  Also, informed that if PET is not approved we will switch to CT. Jason Lin agreeable.   Please schedule pt for PET and inform Jason Lin (caregiver) of appt [phone]).  Jason Lin will also like to be informed of PET appt.

## 2021-12-15 NOTE — Telephone Encounter (Signed)
Called report  IMPRESSION: 1. Signs of new celiac nodal enlargement in the short interval since the October ninth evaluation. Despite single lymph node in a somewhat unusual and isolated location on current imaging these findings are suspicious for metastasis based on size. Consider PET imaging or dedicated abdominal imaging for further evaluation. 2. 4 cm ascending thoracic aortic dilation. Could consider the following or attention on subsequent imaging. Recommend annual imaging followup by CTA or MRA. This recommendation follows 2010 ACCF/AHA/AATS/ACR/ASA/SCA/SCAI/SIR/STS/SVM Guidelines for the Diagnosis and Management of Patients with Thoracic Aortic Disease. Circulation. 2010; 121: T342-A768. Aortic aneurysm NOS (ICD10-I71.9) 3. Stable small nodules scattered throughout the chest, some with calcification. Findings are likely benign but warrants attention on follow-up, not changed since January 2023. 4. Cholelithiasis. 5. Three-vessel coronary artery disease. 6. Aortic atherosclerosis.   Aortic Atherosclerosis (ICD10-I70.0).   These results will be called to the ordering clinician or representative by the Radiologist Assistant, and communication documented in the PACS or [employer].     Electronically Signed   By: Zetta Bills M.D.   On: 12/15/2021 11:20

## 2021-12-16 ENCOUNTER — Other Ambulatory Visit: Payer: Self-pay

## 2021-12-16 DIAGNOSIS — C4A9 Merkel cell carcinoma, unspecified: Secondary | ICD-10-CM

## 2021-12-29 ENCOUNTER — Ambulatory Visit: Payer: Medicare Other

## 2022-01-11 ENCOUNTER — Other Ambulatory Visit: Payer: Medicare PPO

## 2022-01-12 ENCOUNTER — Ambulatory Visit
Admission: RE | Admit: 2022-01-12 | Discharge: 2022-01-12 | Disposition: A | Discharge status: Home / Self Care | Payer: Medicare PPO | Source: Ambulatory Visit | Attending: Oncology | Admitting: Oncology

## 2022-01-12 DIAGNOSIS — H5440 Blindness, one eye, unspecified eye: Secondary | ICD-10-CM | POA: Diagnosis not present

## 2022-01-12 DIAGNOSIS — C4A9 Merkel cell carcinoma, unspecified: Secondary | ICD-10-CM | POA: Diagnosis not present

## 2022-01-12 DIAGNOSIS — H409 Unspecified glaucoma: Secondary | ICD-10-CM | POA: Insufficient documentation

## 2022-01-12 DIAGNOSIS — R079 Chest pain, unspecified: Secondary | ICD-10-CM | POA: Insufficient documentation

## 2022-01-12 LAB — GLUCOSE, CAPILLARY: Glucose-Capillary: 88 mg/dL (ref 70–99)

## 2022-01-12 MED ORDER — FLUDEOXYGLUCOSE F - 18 (FDG) INJECTION
7.7000 | Freq: Once | INTRAVENOUS | Status: AC | PRN
  Administered 2022-01-12: 8.39 via INTRAVENOUS

## 2022-01-15 ENCOUNTER — Emergency Department: Payer: Medicare PPO

## 2022-01-15 ENCOUNTER — Other Ambulatory Visit: Payer: Self-pay

## 2022-01-15 ENCOUNTER — Telehealth: Payer: Self-pay

## 2022-01-15 ENCOUNTER — Inpatient Hospital Stay
Admission: EM | Admit: 2022-01-15 | Discharge: 2022-01-20 | DRG: 843 | Disposition: A | Discharge status: Skilled Nursing Facility | Payer: Medicare PPO | Attending: Internal Medicine | Admitting: Internal Medicine

## 2022-01-15 DIAGNOSIS — E785 Hyperlipidemia, unspecified: Secondary | ICD-10-CM | POA: Diagnosis present

## 2022-01-15 DIAGNOSIS — R001 Bradycardia, unspecified: Secondary | ICD-10-CM | POA: Diagnosis present

## 2022-01-15 DIAGNOSIS — Z91041 Radiographic dye allergy status: Secondary | ICD-10-CM

## 2022-01-15 DIAGNOSIS — Z85820 Personal history of malignant melanoma of skin: Secondary | ICD-10-CM

## 2022-01-15 DIAGNOSIS — C7B8 Other secondary neuroendocrine tumors: Secondary | ICD-10-CM | POA: Diagnosis not present

## 2022-01-15 DIAGNOSIS — Z7951 Long term (current) use of inhaled steroids: Secondary | ICD-10-CM

## 2022-01-15 DIAGNOSIS — N400 Enlarged prostate without lower urinary tract symptoms: Secondary | ICD-10-CM | POA: Diagnosis present

## 2022-01-15 DIAGNOSIS — Z820 Family history of epilepsy and other diseases of the nervous system: Secondary | ICD-10-CM

## 2022-01-15 DIAGNOSIS — Z8249 Family history of ischemic heart disease and other diseases of the circulatory system: Secondary | ICD-10-CM

## 2022-01-15 DIAGNOSIS — C4A9 Merkel cell carcinoma, unspecified: Secondary | ICD-10-CM | POA: Diagnosis present

## 2022-01-15 DIAGNOSIS — F03918 Unspecified dementia, unspecified severity, with other behavioral disturbance: Secondary | ICD-10-CM | POA: Diagnosis present

## 2022-01-15 DIAGNOSIS — C7931 Secondary malignant neoplasm of brain: Principal | ICD-10-CM | POA: Diagnosis present

## 2022-01-15 DIAGNOSIS — I2510 Atherosclerotic heart disease of native coronary artery without angina pectoris: Secondary | ICD-10-CM | POA: Diagnosis present

## 2022-01-15 DIAGNOSIS — Z85821 Personal history of Merkel cell carcinoma: Secondary | ICD-10-CM

## 2022-01-15 DIAGNOSIS — K219 Gastro-esophageal reflux disease without esophagitis: Secondary | ICD-10-CM | POA: Diagnosis present

## 2022-01-15 DIAGNOSIS — J449 Chronic obstructive pulmonary disease, unspecified: Secondary | ICD-10-CM | POA: Diagnosis present

## 2022-01-15 DIAGNOSIS — H9190 Unspecified hearing loss, unspecified ear: Secondary | ICD-10-CM | POA: Diagnosis present

## 2022-01-15 DIAGNOSIS — I129 Hypertensive chronic kidney disease with stage 1 through stage 4 chronic kidney disease, or unspecified chronic kidney disease: Secondary | ICD-10-CM | POA: Diagnosis present

## 2022-01-15 DIAGNOSIS — Z888 Allergy status to other drugs, medicaments and biological substances status: Secondary | ICD-10-CM

## 2022-01-15 DIAGNOSIS — G936 Cerebral edema: Secondary | ICD-10-CM | POA: Diagnosis present

## 2022-01-15 DIAGNOSIS — N184 Chronic kidney disease, stage 4 (severe): Secondary | ICD-10-CM | POA: Diagnosis present

## 2022-01-15 DIAGNOSIS — I10 Essential (primary) hypertension: Secondary | ICD-10-CM | POA: Diagnosis present

## 2022-01-15 DIAGNOSIS — G9389 Other specified disorders of brain: Secondary | ICD-10-CM

## 2022-01-15 DIAGNOSIS — Z79899 Other long term (current) drug therapy: Secondary | ICD-10-CM

## 2022-01-15 DIAGNOSIS — Z923 Personal history of irradiation: Secondary | ICD-10-CM

## 2022-01-15 DIAGNOSIS — Z66 Do not resuscitate: Secondary | ICD-10-CM | POA: Diagnosis not present

## 2022-01-15 LAB — CBC
HCT: 37.2 % — ABNORMAL LOW (ref 39.0–52.0)
Hemoglobin: 11.5 g/dL — ABNORMAL LOW (ref 13.0–17.0)
MCH: 30.9 pg (ref 26.0–34.0)
MCHC: 30.9 g/dL (ref 30.0–36.0)
MCV: 100 fL (ref 80.0–100.0)
Platelets: 134 10*3/uL — ABNORMAL LOW (ref 150–400)
RBC: 3.72 MIL/uL — ABNORMAL LOW (ref 4.22–5.81)
RDW: 12.4 % (ref 11.5–15.5)
WBC: 4.9 10*3/uL (ref 4.0–10.5)
nRBC: 0 % (ref 0.0–0.2)

## 2022-01-15 LAB — BASIC METABOLIC PANEL
Anion gap: 5 (ref 5–15)
BUN: 25 mg/dL — ABNORMAL HIGH (ref 8–23)
CO2: 24 mmol/L (ref 22–32)
Calcium: 8.9 mg/dL (ref 8.9–10.3)
Chloride: 109 mmol/L (ref 98–111)
Creatinine, Ser: 2.24 mg/dL — ABNORMAL HIGH (ref 0.61–1.24)
GFR, Estimated: 29 mL/min — ABNORMAL LOW (ref >60)
Glucose, Bld: 111 mg/dL — ABNORMAL HIGH (ref 70–99)
Potassium: 4.2 mmol/L (ref 3.5–5.1)
Sodium: 138 mmol/L (ref 135–145)

## 2022-01-15 LAB — TROPONIN I (HIGH SENSITIVITY)
Troponin I (High Sensitivity): 5 ng/L (ref <18)
Troponin I (High Sensitivity): 6 ng/L (ref <18)

## 2022-01-15 MED ORDER — SIMVASTATIN 20 MG PO TABS
20.0000 mg | ORAL_TABLET | Freq: Every day | ORAL | Status: DC
  Administered 2022-01-15 – 2022-01-19 (×5): 20 mg via ORAL
  Filled 2022-01-15 (×5): qty 1

## 2022-01-15 MED ORDER — PANTOPRAZOLE SODIUM 40 MG PO TBEC
40.0000 mg | DELAYED_RELEASE_TABLET | Freq: Every day | ORAL | Status: DC
  Administered 2022-01-16 – 2022-01-20 (×5): 40 mg via ORAL
  Filled 2022-01-15 (×5): qty 1

## 2022-01-15 MED ORDER — ACETAMINOPHEN 325 MG PO TABS
650.0000 mg | ORAL_TABLET | Freq: Four times a day (QID) | ORAL | Status: DC | PRN
  Administered 2022-01-16 – 2022-01-20 (×6): 650 mg via ORAL
  Filled 2022-01-15 (×9): qty 2

## 2022-01-15 MED ORDER — ALBUTEROL SULFATE HFA 108 (90 BASE) MCG/ACT IN AERS: 2.0000 | INHALATION_SPRAY | Freq: Four times a day (QID) | RESPIRATORY_TRACT | Status: DC | PRN

## 2022-01-15 MED ORDER — PREDNISOLONE ACETATE 1 % OP SUSP
1.0000 [drp] | Freq: Every day | OPHTHALMIC | Status: DC
  Administered 2022-01-16 – 2022-01-20 (×25): 1 [drp] via OPHTHALMIC
  Filled 2022-01-15: qty 1

## 2022-01-15 MED ORDER — PSYLLIUM 95 % PO PACK
1.0000 | PACK | Freq: Every day | ORAL | Status: DC
  Administered 2022-01-18 – 2022-01-20 (×2): 1 via ORAL
  Filled 2022-01-15 (×5): qty 1

## 2022-01-15 MED ORDER — POLYETHYLENE GLYCOL 3350 17 G PO PACK: 17.0000 g | PACK | Freq: Every day | ORAL | Status: DC | PRN

## 2022-01-15 MED ORDER — GADOBUTROL 1 MMOL/ML IV SOLN
6.0000 mL | Freq: Once | INTRAVENOUS | Status: AC | PRN
  Administered 2022-01-15: 7.5 mL via INTRAVENOUS

## 2022-01-15 MED ORDER — TAMSULOSIN HCL 0.4 MG PO CAPS
0.4000 mg | ORAL_CAPSULE | Freq: Every day | ORAL | Status: DC
  Administered 2022-01-15 – 2022-01-19 (×5): 0.4 mg via ORAL
  Filled 2022-01-15 (×5): qty 1

## 2022-01-15 MED ORDER — MOMETASONE FURO-FORMOTEROL FUM 100-5 MCG/ACT IN AERO
2.0000 | INHALATION_SPRAY | Freq: Two times a day (BID) | RESPIRATORY_TRACT | Status: DC
  Administered 2022-01-15 – 2022-01-20 (×9): 2 via RESPIRATORY_TRACT
  Filled 2022-01-15 (×2): qty 8.8

## 2022-01-15 MED ORDER — PSYLLIUM 0.52 G PO CAPS: 0.5200 g | ORAL_CAPSULE | Freq: Every day | ORAL | Status: DC

## 2022-01-15 MED ORDER — OXYCODONE HCL 5 MG PO TABS
2.5000 mg | ORAL_TABLET | Freq: Four times a day (QID) | ORAL | Status: DC | PRN
  Administered 2022-01-15 – 2022-01-18 (×6): 2.5 mg via ORAL
  Filled 2022-01-15 (×6): qty 1

## 2022-01-15 MED ORDER — ACETAMINOPHEN 650 MG RE SUPP: 650.0000 mg | Freq: Four times a day (QID) | RECTAL | Status: DC | PRN

## 2022-01-15 MED ORDER — ALBUTEROL SULFATE (2.5 MG/3ML) 0.083% IN NEBU: 2.5000 mg | INHALATION_SOLUTION | Freq: Four times a day (QID) | RESPIRATORY_TRACT | Status: DC | PRN

## 2022-01-15 MED ORDER — POTASSIUM CHLORIDE CRYS ER 20 MEQ PO TBCR
20.0000 meq | EXTENDED_RELEASE_TABLET | Freq: Every day | ORAL | Status: DC
  Administered 2022-01-16 – 2022-01-20 (×5): 20 meq via ORAL
  Filled 2022-01-15 (×5): qty 1

## 2022-01-15 MED ORDER — DORZOLAMIDE HCL-TIMOLOL MAL 2-0.5 % OP SOLN
1.0000 [drp] | Freq: Two times a day (BID) | OPHTHALMIC | Status: DC
  Administered 2022-01-15 – 2022-01-20 (×10): 1 [drp] via OPHTHALMIC
  Filled 2022-01-15 (×2): qty 10

## 2022-01-15 MED ORDER — METOPROLOL SUCCINATE ER 25 MG PO TB24
25.0000 mg | ORAL_TABLET | Freq: Every day | ORAL | Status: DC
  Administered 2022-01-16 – 2022-01-20 (×4): 25 mg via ORAL
  Filled 2022-01-15 (×5): qty 1

## 2022-01-15 MED ORDER — TRAZODONE HCL 50 MG PO TABS
100.0000 mg | ORAL_TABLET | Freq: Every day | ORAL | Status: DC
  Administered 2022-01-15 – 2022-01-19 (×4): 100 mg via ORAL
  Filled 2022-01-15 (×4): qty 2

## 2022-01-15 MED ORDER — DEXAMETHASONE SODIUM PHOSPHATE 10 MG/ML IJ SOLN
10.0000 mg | Freq: Once | INTRAMUSCULAR | Status: AC
  Administered 2022-01-15: 10 mg via INTRAVENOUS
  Filled 2022-01-15: qty 1

## 2022-01-15 MED ORDER — ONDANSETRON HCL 4 MG/2ML IJ SOLN: 4.0000 mg | Freq: Four times a day (QID) | INTRAMUSCULAR | Status: DC | PRN

## 2022-01-15 MED ORDER — ENOXAPARIN SODIUM 30 MG/0.3ML IJ SOSY
30.0000 mg | PREFILLED_SYRINGE | INTRAMUSCULAR | Status: DC
  Administered 2022-01-15 – 2022-01-16 (×2): 30 mg via SUBCUTANEOUS
  Filled 2022-01-15 (×3): qty 0.3

## 2022-01-15 MED ORDER — ONDANSETRON HCL 4 MG PO TABS
4.0000 mg | ORAL_TABLET | Freq: Four times a day (QID) | ORAL | Status: DC | PRN
  Administered 2022-01-18: 4 mg via ORAL
  Filled 2022-01-15: qty 1

## 2022-01-15 MED ORDER — FLUOXETINE HCL 20 MG PO CAPS
20.0000 mg | ORAL_CAPSULE | Freq: Every day | ORAL | Status: DC
  Administered 2022-01-16 – 2022-01-20 (×5): 20 mg via ORAL
  Filled 2022-01-15 (×5): qty 1

## 2022-01-15 MED ORDER — MELATONIN 5 MG PO TABS
5.0000 mg | ORAL_TABLET | Freq: Every day | ORAL | Status: DC
  Administered 2022-01-15 – 2022-01-19 (×5): 5 mg via ORAL
  Filled 2022-01-15 (×5): qty 1

## 2022-01-15 MED ORDER — ATROPINE SULFATE 1 % OP SOLN
1.0000 [drp] | Freq: Three times a day (TID) | OPHTHALMIC | Status: DC
  Administered 2022-01-15 – 2022-01-20 (×14): 1 [drp] via OPHTHALMIC
  Filled 2022-01-15 (×2): qty 2

## 2022-01-15 MED ORDER — LATANOPROST 0.005 % OP SOLN
1.0000 [drp] | Freq: Every day | OPHTHALMIC | Status: DC
  Administered 2022-01-16 – 2022-01-19 (×4): 1 [drp] via OPHTHALMIC
  Filled 2022-01-15 (×2): qty 2.5

## 2022-01-15 MED ORDER — MEDROXYPROGESTERONE ACETATE 2.5 MG PO TABS
5.0000 mg | ORAL_TABLET | Freq: Two times a day (BID) | ORAL | Status: DC
  Administered 2022-01-15 – 2022-01-20 (×9): 5 mg via ORAL
  Filled 2022-01-15 (×10): qty 2

## 2022-01-15 MED ORDER — DEXAMETHASONE 4 MG PO TABS
4.0000 mg | ORAL_TABLET | Freq: Every day | ORAL | Status: DC
  Administered 2022-01-16 – 2022-01-20 (×5): 4 mg via ORAL
  Filled 2022-01-15 (×5): qty 1

## 2022-01-15 NOTE — Assessment & Plan Note (Signed)
No evidence of wheezing on examination and patient denies any pulmonary symptoms.  - Continue home bronchodilators
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
## 2022-01-15 NOTE — Assessment & Plan Note (Addendum)
EKG with sinus bradycardia.  Given new brain mets with vasogenic edema, this was initial concerning, however per chart review, patient has a history of sinus bradycardia on several instances throughout this year.

## 2022-01-15 NOTE — Assessment & Plan Note (Signed)
Continue home antihypertensives 
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
## 2022-01-15 NOTE — ED Triage Notes (Signed)
Patient has a hx of merkel cell and had a brain scan on 12/19 that showed a brain mass.  Caregiver reports patient has been confused and agitated more recently.  Patient complains of chest pain. +SOB denies n/v

## 2022-01-15 NOTE — ED Provider Triage Note (Signed)
Emergency Medicine Provider Triage Evaluation Note  Jason Lin, a 81 y.o. male  was evaluated in triage.  Pt complains of increased confusion and agitated mood in the last few months secondary to history of Merkel cell carcinoma and abnormal brain scan from 12/19.  The care of Dr. Tasia Catchings has been advised to report to the ED for stat brain MRI.  Patient complaint is some generalized bodyaches, chest pain, shortness of breath.  Review of Systems  Positive: AMS, chest pain, SOB Negative: FCS  Physical Exam  BP 121/63 (BP Location: Left Arm)   Pulse (!) 52   Temp 98 F (36.7 C) (Oral)   Resp 18   Ht '5\' 5"'$  (1.651 m)   Wt 67.6 kg   SpO2 100%   BMI 24.79 kg/m  Gen:   Awake, no distress  NAD Resp:  Normal effort CTA MSK:   Moves extremities without difficulty  Other:    Medical Decision Making  Medically screening exam initiated at 12:22 PM.  Appropriate orders placed.  [name] was informed that the remainder of the evaluation will be completed by another provider, this initial triage assessment does not replace that evaluation, and the importance of remaining in the ED until their evaluation is complete.  Geriatric patient with a history of Merkel cell carcinoma presents with an abnormal brain mass and reports of increased agitation and confusion.  Dr. Tasia Catchings has requested patient receive a stat MRI of the brain.   [name], PA-C 01/15/22 1224

## 2022-01-15 NOTE — Assessment & Plan Note (Signed)
Renal function currently at baseline. Cr 2.3, at baseline Avoid nephrotoxic agents

## 2022-01-15 NOTE — Assessment & Plan Note (Signed)
-   Oncology following; appreciate their recommendations

## 2022-01-15 NOTE — Assessment & Plan Note (Addendum)
Patient is presenting with some mood changes per family, however I discussed with oncology and he is close to baseline at this time.  On examination, there are no focal neurological deficits.  MRI of the brain demonstrates multiple peripherally enhancing partially cystic masses concerning for metastatic disease.  Largest of these masses is 2.7 cm with some surrounding edema but no mass effect. - stable - Oncology consulted; recommended continued outpatient management, - Neurology consulted per oncology recommendations and attempt to ensure there is no infection nor cysticercosis noted and they are in agreement with the metastases diagnoses. - Status post Decadron IV 10 mg and now on Decadron 4 mg p.o.12/23--and continue - Frequent neurochecks Pending placement

## 2022-01-15 NOTE — Telephone Encounter (Addendum)
Per Dr. Tasia Catchings, pt has mass in the brain and will need STAT MRI brain w wo contrast.   called and informed [facility]) of results and recommendations. Laveda Abbe stated that pt's mood had changed and that pt had been confused for the past couple of months. Informed him that if patient is having confusion that is worsening to go to ER. He verbalized understanding.   Please schedule STAT MRI brain and contact [name] for transportation.

## 2022-01-16 DIAGNOSIS — H9190 Unspecified hearing loss, unspecified ear: Secondary | ICD-10-CM | POA: Diagnosis present

## 2022-01-16 DIAGNOSIS — K219 Gastro-esophageal reflux disease without esophagitis: Secondary | ICD-10-CM | POA: Diagnosis present

## 2022-01-16 DIAGNOSIS — F03918 Unspecified dementia, unspecified severity, with other behavioral disturbance: Secondary | ICD-10-CM | POA: Diagnosis present

## 2022-01-16 DIAGNOSIS — Z85821 Personal history of Merkel cell carcinoma: Secondary | ICD-10-CM | POA: Diagnosis not present

## 2022-01-16 DIAGNOSIS — Z85820 Personal history of malignant melanoma of skin: Secondary | ICD-10-CM | POA: Diagnosis not present

## 2022-01-16 DIAGNOSIS — Z888 Allergy status to other drugs, medicaments and biological substances status: Secondary | ICD-10-CM | POA: Diagnosis not present

## 2022-01-16 DIAGNOSIS — J449 Chronic obstructive pulmonary disease, unspecified: Secondary | ICD-10-CM | POA: Diagnosis present

## 2022-01-16 DIAGNOSIS — Z820 Family history of epilepsy and other diseases of the nervous system: Secondary | ICD-10-CM | POA: Diagnosis not present

## 2022-01-16 DIAGNOSIS — C7931 Secondary malignant neoplasm of brain: Secondary | ICD-10-CM | POA: Diagnosis present

## 2022-01-16 DIAGNOSIS — Z515 Encounter for palliative care: Secondary | ICD-10-CM | POA: Diagnosis not present

## 2022-01-16 DIAGNOSIS — Z66 Do not resuscitate: Secondary | ICD-10-CM | POA: Diagnosis not present

## 2022-01-16 DIAGNOSIS — Z8249 Family history of ischemic heart disease and other diseases of the circulatory system: Secondary | ICD-10-CM | POA: Diagnosis not present

## 2022-01-16 DIAGNOSIS — Z91041 Radiographic dye allergy status: Secondary | ICD-10-CM | POA: Diagnosis not present

## 2022-01-16 DIAGNOSIS — R001 Bradycardia, unspecified: Secondary | ICD-10-CM | POA: Diagnosis present

## 2022-01-16 DIAGNOSIS — N184 Chronic kidney disease, stage 4 (severe): Secondary | ICD-10-CM | POA: Diagnosis present

## 2022-01-16 DIAGNOSIS — C7B8 Other secondary neuroendocrine tumors: Secondary | ICD-10-CM | POA: Diagnosis present

## 2022-01-16 DIAGNOSIS — Z79899 Other long term (current) drug therapy: Secondary | ICD-10-CM | POA: Diagnosis not present

## 2022-01-16 DIAGNOSIS — Z7951 Long term (current) use of inhaled steroids: Secondary | ICD-10-CM | POA: Diagnosis not present

## 2022-01-16 DIAGNOSIS — Z923 Personal history of irradiation: Secondary | ICD-10-CM | POA: Diagnosis not present

## 2022-01-16 DIAGNOSIS — C4A9 Merkel cell carcinoma, unspecified: Secondary | ICD-10-CM | POA: Diagnosis present

## 2022-01-16 DIAGNOSIS — E785 Hyperlipidemia, unspecified: Secondary | ICD-10-CM | POA: Diagnosis present

## 2022-01-16 DIAGNOSIS — G936 Cerebral edema: Secondary | ICD-10-CM | POA: Diagnosis present

## 2022-01-16 DIAGNOSIS — I129 Hypertensive chronic kidney disease with stage 1 through stage 4 chronic kidney disease, or unspecified chronic kidney disease: Secondary | ICD-10-CM | POA: Diagnosis present

## 2022-01-16 DIAGNOSIS — N400 Enlarged prostate without lower urinary tract symptoms: Secondary | ICD-10-CM | POA: Diagnosis present

## 2022-01-16 DIAGNOSIS — I2510 Atherosclerotic heart disease of native coronary artery without angina pectoris: Secondary | ICD-10-CM | POA: Diagnosis present

## 2022-01-16 LAB — BASIC METABOLIC PANEL
Anion gap: 8 (ref 5–15)
BUN: 26 mg/dL — ABNORMAL HIGH (ref 8–23)
CO2: 18 mmol/L — ABNORMAL LOW (ref 22–32)
Calcium: 8.9 mg/dL (ref 8.9–10.3)
Chloride: 109 mmol/L (ref 98–111)
Creatinine, Ser: 2.3 mg/dL — ABNORMAL HIGH (ref 0.61–1.24)
GFR, Estimated: 28 mL/min — ABNORMAL LOW (ref >60)
Glucose, Bld: 208 mg/dL — ABNORMAL HIGH (ref 70–99)
Potassium: 4.1 mmol/L (ref 3.5–5.1)
Sodium: 135 mmol/L (ref 135–145)

## 2022-01-16 LAB — CBC
HCT: 32.4 % — ABNORMAL LOW (ref 39.0–52.0)
Hemoglobin: 10.4 g/dL — ABNORMAL LOW (ref 13.0–17.0)
MCH: 31.2 pg (ref 26.0–34.0)
MCHC: 32.1 g/dL (ref 30.0–36.0)
MCV: 97.3 fL (ref 80.0–100.0)
Platelets: 122 10*3/uL — ABNORMAL LOW (ref 150–400)
RBC: 3.33 MIL/uL — ABNORMAL LOW (ref 4.22–5.81)
RDW: 12.2 % (ref 11.5–15.5)
WBC: 4.3 10*3/uL (ref 4.0–10.5)
nRBC: 0 % (ref 0.0–0.2)

## 2022-01-16 MED ORDER — DEXAMETHASONE 4 MG PO TABS: 4.0000 mg | ORAL_TABLET | Freq: Every day | ORAL | 0 refills | Status: DC

## 2022-01-16 NOTE — Hospital Course (Signed)
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  He has chronic kidney disease stage IV, HTN, COPD, BPH, dementia.  Patient has some mental status changes that neurology thinks may be related to his frontal lobe metastasis.  His overall mental state is about at his baseline.  He was seen by oncology as well as neurology during his hospitalization to ensure there was nothing other than probable metastases noted.  Neurology is in agreement with this diagnosis.  Further workup and radiation treatment as well as immunotherapy can be pursued as an outpatient.

## 2022-01-16 NOTE — Progress Notes (Signed)
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  He has chronic kidney disease stage IV, HTN, COPD, BPH, dementia.  Patient has some mental status changes that neurology thinks may be related to his frontal lobe metastasis.  His overall mental state is about at his baseline.  He was seen by oncology as well as neurology during his hospitalization to ensure there was nothing other than probable metastases noted.  Neurology is in agreement with this diagnosis.  Further workup and radiation treatment as well as immunotherapy can be pursued as an outpatient.  Assessment and Plan: * Metastasis to brain [facility]) Patient is presenting with some mood changes per family, however I discussed with oncology and he is close to baseline at this time.  On examination, there are no focal neurological deficits.  MRI of the brain demonstrates multiple peripherally enhancing partially cystic masses concerning for metastatic disease.  Largest of these masses is 2.7 cm with some surrounding edema but no mass effect.  - Oncology consulted; recommended continued outpatient management - Neurology consulted per oncology recommendations and attempt to ensure there is no infection nor cysticercosis noted and they are in agreement with the metastases diagnoses. -Status post Decadron IV 10 mg and now on Decadron 4 mg p.o.12/23--and continue - Frequent neurochecks  Sinus bradycardia EKG with sinus bradycardia.  Given new brain mets with vasogenic edema, this was initial concerning, however per chart review, patient has a history of sinus bradycardia on several instances throughout this year.  Merkel cell cancer [facility]) - Oncology following; appreciate their recommendations  CKD (chronic kidney disease)  stage 4, GFR 15-29 ml/min (HCC) Renal function currently at baseline.  -Monitor BMP while admitted  Hypertension - Continue home antihypertensives  COPD (chronic obstructive pulmonary disease) ([place]) No evidence of wheezing on examination and patient denies any pulmonary symptoms.  - Continue home bronchodilators        Subjective: Patient is without complaint  Physical Exam: Vitals:   01/16/22 0809 01/16/22 1000 01/16/22 1208 01/16/22 1527  BP: 137/64  132/60 105/63  Pulse: (!) 44 (!) 56 62 (!) 57  Resp: '16  16 20  '$ Temp: 98.7 F (37.1 C)  98.4 F (36.9 C) 98.9 F (37.2 C)  TempSrc:      SpO2: 96%  98% 99%  Weight:      Height:       Physical Examination: General appearance - alert, well appearing, and in no distress Chest - clear to auscultation, no wheezes, rales or rhonchi, symmetric air entry Heart - normal rate, regular rhythm, normal S1, S2, no murmurs, rubs, clicks or gallops Abdomen - soft, nontender, nondistended, no masses or organomegaly  Data Reviewed: Results for orders placed or performed during the hospital encounter of 01/15/22 (from the past 24 hour(s))  Troponin I (High Sensitivity)     Status: None   Collection Time: 01/15/22  6:10 PM  Result Value Ref Range   Troponin I (High Sensitivity) 6 <18 ng/L  CBC     Status: Abnormal   Collection Time: 01/16/22  6:05 AM  Result Value Ref Range   WBC 4.3 4.0 - 10.5 K/uL   RBC 3.33 (L) 4.22 - 5.81 MIL/uL   Hemoglobin 10.4 (L) 13.0 - 17.0 g/dL   HCT 32.4 (L)  39.0 - 52.0 %   MCV 97.3 80.0 - 100.0 fL   MCH 31.2 26.0 - 34.0 pg   MCHC 32.1 30.0 - 36.0 g/dL   RDW 12.2 11.5 - 15.5 %   Platelets 122 (L) 150 - 400 K/uL   nRBC 0.0 0.0 - 0.2 %  Basic metabolic panel     Status: Abnormal   Collection Time: 01/16/22  6:05 AM  Result Value Ref Range   Sodium 135 135 - 145 mmol/L   Potassium 4.1 3.5 - 5.1 mmol/L   Chloride 109 98 - 111 mmol/L   CO2 18 (L) 22 - 32 mmol/L   Glucose, Bld 208 (H) 70 - 99 mg/dL    BUN 26 (H) 8 - 23 mg/dL   Creatinine, Ser 2.30 (H) 0.61 - 1.24 mg/dL   Calcium 8.9 8.9 - 10.3 mg/dL   GFR, Estimated 28 (L) >60 mL/min   Anion gap 8 5 - 15     Family Communication: Patient's sister-in-law, neurology spoke with his brother.  Disposition: Status is: Inpatient The patient will require care spanning > 2 midnights and should be moved to inpatient because: Unsafe discharge plan  Planned Discharge Destination: Skilled nursing facility    Time spent: 36 minutes  Author: Donnamae Jude, MD 01/16/2022 4:35 PM  For on call review [email or web address].

## 2022-01-16 NOTE — NC FL2 (Signed)
[place] LEVEL OF CARE FORM     IDENTIFICATION  Patient Name: Jason Lin Birthdate: 11-25-1940 Sex: male Admission Date (Current Location): 01/15/2022  [facility] and [place] Number:  Engineering geologist and Address:  [facility], [address], [place], [place] [address]      Provider Number: [id number]  Attending Physician Name and Address:  Donnamae Jude, MD  Relative Name and Phone Number:       Current Level of Care: Domiciliary (Rest home) Recommended Level of Care: Assisted Living Facility Prior Approval Number:    Date Approved/Denied:   PASRR Number:    Discharge Plan: Domiciliary (Rest home)    Current Diagnoses: Patient Active Problem List   Diagnosis Date Noted   Metastasis to brain ([place]) 01/15/2022   COPD (chronic obstructive pulmonary disease) ([place]) 01/15/2022   Hypokalemia 12/04/2021   Weight loss 12/04/2021   Sinus bradycardia 08/31/2021   Scrotal pain    Acute renal failure superimposed on chronic kidney disease ([place]) 07/21/2021   GERD (gastroesophageal reflux disease) 03/09/2021   Hx of echocardiogram 03/09/2021   Goals of care, counseling/discussion 03/09/2021   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   Stage 3b chronic kidney disease (CKD) ([place]) 02/25/2021   Chest pain 02/23/2021   Closed fracture sternum 02/23/2021   Axillary lymphadenopathy 02/23/2021   HTN (hypertension) 02/23/2021   Acute renal failure superimposed on stage 4 chronic kidney disease ([place]) 02/23/2021   Hypertension 02/23/2021   HLD (hyperlipidemia) 02/23/2021   Depression 02/23/2021   BPH (benign prostatic hyperplasia) 02/23/2021   Positive D-dimer    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[place])    Constipation 08/22/2020   B12 deficiency 08/07/2020   CKD (chronic kidney disease) stage 4, GFR 15-29 ml/min (HCC) 08/07/2020   Closed nondisplaced fracture of medial cuneiform of right foot 08/07/2020   Compression fx, thoracic spine, closed, initial encounter  ([place]) 08/07/2020   Altered mental status 07/31/2020   Acute diverticulitis 02/29/2020   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   Coronary atherosclerosis 07/18/2011   Anxiety 07/18/2011    Orientation RESPIRATION BLADDER Height & Weight     Self, Time, Situation, Place  Normal Continent Weight: 149 lb (67.6 kg) Height:  '5\' 5"'$  (165.1 cm)  BEHAVIORAL SYMPTOMS/MOOD NEUROLOGICAL BOWEL NUTRITION STATUS      Continent Diet  AMBULATORY STATUS COMMUNICATION OF NEEDS Skin   Limited Assist Verbally Normal                       Personal Care Assistance Level of Assistance  Bathing, Feeding, Dressing Bathing Assistance: Limited assistance Feeding assistance: Independent Dressing Assistance: Limited assistance Total Care Assistance: Limited assistance   Functional Limitations Info  Sight, Hearing, Speech Sight Info: Adequate Hearing Info: Adequate Speech Info: Adequate    SPECIAL CARE FACTORS FREQUENCY                       Contractures Contractures Info: Not present    Additional Factors Info  Code Status, Allergies Code Status Info: FULL Allergies Info: Brimonidine  Timolol  Iodinated Contrast Medi           Current Medications (01/16/2022):  This is the current hospital active medication list Current Facility-Administered Medications  Medication Dose Route Frequency Provider Last Rate Last Admin   acetaminophen (TYLENOL) tablet 650 mg  650 mg Oral Q6H PRN Jose Persia, MD   650 mg at 01/16/22 1430   Or   acetaminophen (TYLENOL)  suppository 650 mg  650 mg Rectal Q6H PRN Jose Persia, MD       albuterol (PROVENTIL) (2.5 MG/3ML) 0.083% nebulizer solution 2.5 mg  2.5 mg Nebulization Q6H PRN Alison Murray, RPH       atropine 1 % ophthalmic solution 1 drop  1 drop Left Eye TID Jose Persia, MD   1 drop at 01/16/22 1031   dexamethasone (DECADRON) tablet 4 mg  4 mg Oral Daily Jose Persia, MD   4 mg at 01/16/22 1033    dorzolamide-timolol (COSOPT) 2-0.5 % ophthalmic solution 1 drop  1 drop Both Eyes Q12H Jose Persia, MD   1 drop at 01/16/22 1031   enoxaparin (LOVENOX) injection 30 mg  30 mg Subcutaneous Q24H Jose Persia, MD   30 mg at 01/15/22 2233   FLUoxetine (PROZAC) capsule 20 mg  20 mg Oral Daily Jose Persia, MD   20 mg at 01/16/22 1033   latanoprost (XALATAN) 0.005 % ophthalmic solution 1 drop  1 drop Both Eyes QHS Jose Persia, MD       medroxyPROGESTERone (PROVERA) tablet 5 mg  5 mg Oral BID Jose Persia, MD   5 mg at 01/16/22 1032   melatonin tablet 5 mg  5 mg Oral QHS Jose Persia, MD   5 mg at 01/15/22 2231   metoprolol succinate (TOPROL-XL) 24 hr tablet 25 mg  25 mg Oral Daily Jose Persia, MD   25 mg at 01/16/22 1033   mometasone-formoterol (DULERA) 100-5 MCG/ACT inhaler 2 puff  2 puff Inhalation BID Jose Persia, MD   2 puff at 01/16/22 1038   ondansetron (ZOFRAN) tablet 4 mg  4 mg Oral Q6H PRN Jose Persia, MD       Or   ondansetron (ZOFRAN) injection 4 mg  4 mg Intravenous Q6H PRN Jose Persia, MD       oxyCODONE (Oxy IR/ROXICODONE) immediate release tablet 2.5 mg  2.5 mg Oral Q6H PRN Jose Persia, MD   2.5 mg at 01/16/22 1032   pantoprazole (PROTONIX) EC tablet 40 mg  40 mg Oral Daily Jose Persia, MD   40 mg at 01/16/22 1032   polyethylene glycol (MIRALAX / GLYCOLAX) packet 17 g  17 g Oral Daily PRN Jose Persia, MD       potassium chloride SA (KLOR-CON M) CR tablet 20 mEq  20 mEq Oral Daily Jose Persia, MD   20 mEq at 01/16/22 1033   prednisoLONE acetate (PRED FORTE) 1 % ophthalmic suspension 1 drop  1 drop Left Eye 6 X Daily Jose Persia, MD   1 drop at 01/16/22 1432   psyllium (HYDROCIL/METAMUCIL) 1 packet  1 packet Oral Daily Alison Murray, RPH       simvastatin (ZOCOR) tablet 20 mg  20 mg Oral q1800 Jose Persia, MD   20 mg at 01/15/22 2231   tamsulosin (FLOMAX) capsule 0.4 mg  0.4 mg Oral QPC supper Jose Persia, MD   0.4 mg at  01/15/22 2231   traZODone (DESYREL) tablet 100 mg  100 mg Oral QHS Jose Persia, MD   100 mg at 01/15/22 2230     Discharge Medications: Please see discharge summary for a list of discharge medications.  Relevant Imaging Results:  Relevant Lab Results:   Additional Information SS [phone]  Gerilyn Pilgrim, LCSW
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
## 2022-01-16 NOTE — Care Management Obs Status (Signed)
[facility] NOTIFICATION   Patient Details  Name: Jason Lin MRN: [id number] Date of Birth: [birth year]   Medicare Observation Status Notification Given:  Yes    Ieasha Boerema, LCSW 01/16/2022, 3:34 PM

## 2022-01-16 NOTE — TOC Transition Note (Signed)
Transition of Care [facility]) - CM/SW Discharge Note   Patient Details  Name: Jason Lin MRN: [id number] Date of Birth: Jun 21, 1940  [facility] of [facility]) CM/SW Contact:  Gerilyn Pilgrim, LCSW Phone Number: 01/16/2022, 4:11 PM   Clinical Narrative:   FL2 and discharge summary sent to [facility]. RN contacting family for transport. No additional needs.     Final next level of care: Assisted Living Barriers to Discharge: Barriers Resolved   Patient Goals and CMS Choice      Discharge Placement                         Discharge Plan and Services Additional resources added to the After Visit Summary for                                       Social Determinants of Health (SDOH) Interventions SDOH Screenings   Food Insecurity: No Food Insecurity (01/16/2022)  Housing: Low Risk  (01/16/2022)  Transportation Needs: No Transportation Needs (01/16/2022)  Utilities: Not At Risk (01/16/2022)  Tobacco Use: Low Risk  (01/15/2022)     Readmission Risk Interventions     No data to display

## 2022-01-16 NOTE — Discharge Summary (Addendum)
Physician Discharge Summary   Patient: Jason Lin MRN: [id number] DOB: Mar 30, 1940  Admit date:     01/15/2022  Discharge date: 01/16/22  Discharge Physician: Donnamae Jude   PCP: Pcp, No   Recommendations at discharge:   Continue Decadron 4 mg daily Outpatient follow-up with oncology for radiation oncology referral Consideration of outpatient immunotherapy or other systemic treatments -by medical oncology  Discharge Diagnoses: Principal Problem:   Metastasis to brain [facility]) Active Problems:   Sinus bradycardia   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HCC)   CKD (chronic kidney disease) stage 4, GFR 15-29 ml/min (HCC)   Hypertension   COPD (chronic obstructive pulmonary disease) [facility])  Hospital Course: Patient is an 81 year old male with 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  He has chronic kidney disease stage IV, HTN, COPD, BPH, dementia.  Patient has some mental status changes that neurology thinks may be related to his frontal lobe metastasis.  His overall mental state is about at his baseline.  He was seen by oncology as well as neurology during his hospitalization to ensure there was nothing other than probable metastases noted.  Neurology is in agreement with this diagnosis.  Further workup and radiation treatment as well as immunotherapy can be pursued as an outpatient.  Assessment and Plan: * Metastasis to brain [facility]) Patient is presenting with some mood changes per family, however I discussed with oncology and he is close to baseline at this time.  On examination, there are no focal neurological deficits.  MRI of the brain demonstrates multiple peripherally enhancing partially cystic masses concerning for metastatic disease.  Largest of these masses is 2.7 cm with some surrounding edema but no mass effect.  - Oncology consulted; recommended continued outpatient management - Neurology consulted per oncology recommendations and attempt to ensure there is no  infection nor cysticercosis noted and they are in agreement with the metastases diagnoses. -Status post Decadron IV 10 mg and now on Decadron 4 mg p.o.12/23--and continue - Frequent neurochecks  Sinus bradycardia EKG with sinus bradycardia.  Given new brain mets with vasogenic edema, this was initial concerning, however per chart review, patient has a history of sinus bradycardia on several instances throughout this year.  Merkel cell cancer [facility]) - Oncology following; appreciate their recommendations  CKD (chronic kidney disease) stage 4, GFR 15-29 ml/min (HCC) Renal function currently at baseline.  -Monitor BMP while admitted  Hypertension - Continue home antihypertensives  COPD (chronic obstructive pulmonary disease) ([place]) No evidence of wheezing on examination and patient denies any pulmonary symptoms.  - Continue home bronchodilators         Consultants: Oncology, neurology Procedures performed: MRI of the brain Disposition: Skilled nursing facility Diet recommendation:  Discharge Diet Orders (From admission, onward)     Start     Ordered   01/16/22 0000  Diet - low sodium heart healthy        01/16/22 1603           Cardiac diet DISCHARGE MEDICATION: Allergies as of 01/16/2022       Reactions   Brimonidine Itching   Timolol Itching   Iodinated Contrast Media Other (See Comments)        Medication List     TAKE these medications    acetaminophen 500 MG tablet Commonly known as: TYLENOL Take 1,000 mg by mouth every 8 (eight) hours as needed.   Advair Diskus 100-50 MCG/ACT Aepb Generic drug: fluticasone-salmeterol Inhale 1 puff into the lungs 2 (two) times  daily.   albuterol 108 (90 Base) MCG/ACT inhaler Commonly known as: VENTOLIN HFA Inhale 2 puffs into the lungs every 6 (six) hours as needed for wheezing or shortness of breath.   atropine 1 % ophthalmic solution Place 1 drop into the left eye 3 (three) times daily.   Biofreeze 4 %  Gel Generic drug: Menthol (Topical Analgesic) Apply 1 Application topically in the morning, at noon, and at bedtime.   bisacodyl 10 MG suppository Commonly known as: DULCOLAX Place 10 mg rectally every three (3) days as needed for moderate constipation.   calcium carbonate 500 MG chewable tablet Commonly known as: TUMS - dosed in mg elemental calcium Chew 1 tablet by mouth 2 (two) times daily.   cyanocobalamin 1000 MCG tablet Commonly known as: VITAMIN B12 Take 1,000 mcg by mouth daily.   dexamethasone 4 MG tablet Commonly known as: DECADRON Take 1 tablet (4 mg total) by mouth daily. Start taking on: January 17, 2022   dorzolamide-timolol 2-0.5 % ophthalmic solution Commonly known as: COSOPT Place 1 drop into both eyes every 12 (twelve) hours.   FLUoxetine 20 MG capsule Commonly known as: PROZAC Take 20 mg by mouth daily.   fluticasone 50 MCG/ACT nasal spray Commonly known as: FLONASE Place 2 sprays into both nostrils daily.   guaiFENesin 100 MG/5ML liquid Commonly known as: ROBITUSSIN Take 10 mLs by mouth every 4 (four) hours as needed for cough or to loosen phlegm.   latanoprost 0.005 % ophthalmic solution Commonly known as: XALATAN Place 1 drop into both eyes at bedtime.   loperamide 2 MG tablet Commonly known as: IMODIUM A-D Take 1 mg by mouth 2 (two) times daily as needed for diarrhea or loose stools.   medroxyPROGESTERone 5 MG tablet Commonly known as: PROVERA Take 5 mg by mouth in the morning and at bedtime.   melatonin 3 MG Tabs tablet Take 6 mg by mouth at bedtime.   metoprolol succinate 25 MG 24 hr tablet Commonly known as: TOPROL-XL Take 25 mg by mouth daily.   nitroGLYCERIN 0.4 MG SL tablet Commonly known as: NITROSTAT Place 0.4 mg under the tongue every 5 (five) minutes as needed for chest pain.   oxyCODONE 5 MG immediate release tablet Commonly known as: Oxy IR/ROXICODONE Take 2.5 mg by mouth every 6 (six) hours as needed for severe pain.    pantoprazole 40 MG tablet Commonly known as: PROTONIX Take 40 mg by mouth daily.   potassium chloride SA 20 MEQ tablet Commonly known as: KLOR-CON M Take 20 mEq by mouth daily.   prednisoLONE acetate 1 % ophthalmic suspension Commonly known as: PRED FORTE Place 1 drop into the left eye 6 (six) times daily.   psyllium 0.52 g capsule Commonly known as: REGULOID Take 0.52 g by mouth daily.   simvastatin 20 MG tablet Commonly known as: ZOCOR Take 20 mg by mouth daily.   tamsulosin 0.4 MG Caps capsule Commonly known as: FLOMAX Take 0.4 mg by mouth daily after supper.   traZODone 100 MG tablet Commonly known as: DESYREL Take 100 mg by mouth at bedtime.        Follow-up Information     Earlie Server, MD Follow up in 1 week(s).   Specialty: Oncology Why: Hospital follow-up Contact information: [place] [address] [phone]                Discharge Exam: Danley Danker Weights   01/15/22 1201  Weight: 67.6 kg   Physical Examination: General appearance -  alert, well appearing, and in no distress Chest - clear to auscultation, no wheezes, rales or rhonchi, symmetric air entry Heart - normal rate, regular rhythm, normal S1, S2, no murmurs, rubs, clicks or gallops Abdomen - soft, nontender, nondistended, no masses or organomegaly  Condition at discharge: stable  The results of significant diagnostics from this hospitalization (including imaging, microbiology, ancillary and laboratory) are listed below for reference.   Imaging Studies: MR Brain W and Wo Contrast  Result Date: 01/15/2022 CLINICAL DATA:  Metastatic disease evaluation EXAM: MRI HEAD WITHOUT AND WITH CONTRAST TECHNIQUE: Multiplanar, multiecho pulse sequences of the brain and surrounding structures were obtained without and with intravenous contrast. CONTRAST:  7.34m GADAVIST GADOBUTROL 1 MMOL/ML IV SOLN COMPARISON:  CT head from the same day. FINDINGS: Brain: Multiple peripherally  enhancing, partially cystic masses, compatible with metastatic disease. The largest lesion measures 2.7 cm in the high left frontal parietal region (series 18, image 118). Additional 5 mm lesion right occipital lobe (series 18, image 66) and 5 mm lesion in the right cerebellum (series 18, image 51). Edema surrounding the dominant lesion. No significant mass effect. No midline shift. No evidence of infarct, acute hemorrhage, or hydrocephalus. Vascular: Major arterial flow voids are maintained at the skull base. Skull and upper cervical spine: Normal marrow signal. Sinuses/Orbits: Clear sinuses.  No acute orbital findings. Other: No mastoid effusions. IMPRESSION: Multiple peripherally enhancing, partially cystic masses, detailed above and suspicious for metastatic disease. Electronically Signed   By: FMargaretha SheffieldM.D.   On: 01/15/2022 15:20   CT Head Wo Contrast  Result Date: 01/15/2022 CLINICAL DATA:  Altered mental status EXAM: CT HEAD WITHOUT CONTRAST TECHNIQUE: Contiguous axial images were obtained from the base of the skull through the vertex without intravenous contrast. RADIATION DOSE REDUCTION: This exam was performed according to the departmental dose-optimization program which includes automated exposure control, adjustment of the mA and/or kV according to patient size and/or use of iterative reconstruction technique. COMPARISON:  None Available. FINDINGS: Brain: No acute intracranial findings are seen. There are no signs of bleeding within the cranium. Cortical sulci are prominent. There is 2.3 cm area of fluid density in left frontoparietal cortex. There is mild vasogenic edema adjacent to this fluid density lesion. There is no effacement of adjacent cortical sulci. Vascular: Unremarkable. Skull: Unremarkable. Sinuses/Orbits: There is mild mucosal thickening in ethmoid and maxillary sinuses. Other: None. IMPRESSION: There are no signs of bleeding within the cranium. There is 2.3 cm fluid density  lesion with adjacent vasogenic edema in the left frontoparietal cortex. Possibility of a space-occupying lesion such as necrotic neoplasm should be considered. Follow-up MRI without and with contrast enhancement may be considered. Electronically Signed   By: PElmer PickerM.D.   On: 01/15/2022 14:07   DG Chest 2 View  Result Date: 01/15/2022 CLINICAL DATA:  Chest EXAM: CHEST - 2 VIEW COMPARISON:  CXR 11/02/21 FINDINGS: No pleural effusion. No pneumothorax. No focal airspace opacity. Normal cardiac and mediastinal contours. No displaced rib fractures. Visualized upper abdomen is unremarkable. Degenerative changes of the bilateral AC joints. IMPRESSION: No active cardiopulmonary disease. Electronically Signed   By: HMarin RobertsM.D.   On: 01/15/2022 12:39   NM PET Image Restage (PS) Whole Body  Addendum Date: 01/15/2022   ADDENDUM REPORT: 01/15/2022 10:39 ADDENDUM: Findings conveyed toZHOU YU on 01/15/2022  at10:25. Electronically Signed   By: SSuzy BouchardM.D.   On: 01/15/2022 10:39   Result Date: 01/15/2022 CLINICAL DATA:  Subsequent treatment strategy for Merkel  cell carcinoma. EXAM: NUCLEAR MEDICINE PET WHOLE BODY TECHNIQUE: 7.7 mCi F-18 FDG was injected intravenously. Full-ring PET imaging was performed from the head to foot after the radiotracer. CT data was obtained and used for attenuation correction and anatomic localization. Fasting blood glucose: 88 mg/dl COMPARISON:  CT 11/02/2021 abdomen pelvis, PET-CT scan 03/03/2021 FINDINGS: Mediastinal blood pool activity: SUV max 2.0 HEAD/NECK: Within the high LEFT frontal lobe there is a region of relative hypometabolism (image number 19 the PET data set). There is a corresponding subtle region hypodensity on accompanying CT (image 17/series 2. No hypermetabolic intense lesions or adenopathy Incidental CT findings: none CHEST: No hypermetabolic mediastinal or hilar nodes. No suspicious pulmonary nodules on the CT scan. Incidental CT findings:  none ABDOMEN/PELVIS: Within the central mesentery 16 mm 14 mm lymph node is mildly hypermetabolic SUV max equal 4.1 (image 22). Smaller aortocaval node with mild metabolic activity (SUV max equal 2 point on image 20. No pelvic adenopathy. No abnormal metabolic activity liver Incidental CT findings: none SKELETON: No focal hypermetabolic activity to suggest skeletal metastasis. No cutaneous lesion. Incidental CT findings: none EXTREMITIES: No abnormal hypermetabolic activity in the lower extremities. No cutaneous lesion. Activity in the RIGHT hand related to radiotracer injection. Incidental CT findings: none IMPRESSION: 1. Relatively Hypometabolic lesion in high LEFT frontal lobe is concerning for a brain metastasis. Recommend MRI of the brain for further evaluation. 2. No evidence of Markel cell carcinoma elsewhere on whole-body scan. 3. Hypermetabolic central mesenteric and periaortic lymph nodes. Favor low-grade lymphoproliferative disorder or reactive adenopathy. Merkel cell carcinoma not favored but not excluded. Electronically Signed: By: Suzy Bouchard M.D. On: 01/15/2022 08:43    Microbiology: Results for orders placed or performed during the hospital encounter of 11/02/21  SARS Coronavirus 2 by RT PCR (hospital order, performed in [facility] hospital lab) *cepheid single result test* Anterior Nasal Swab     Status: None   Collection Time: 11/02/21  5:24 AM   Specimen: Anterior Nasal Swab  Result Value Ref Range Status   SARS Coronavirus 2 by RT PCR NEGATIVE NEGATIVE Final    Comment: (NOTE) SARS-CoV-2 target nucleic acids are NOT DETECTED.  The SARS-CoV-2 RNA is generally detectable in upper and lower respiratory specimens during the acute phase of infection. The lowest concentration of SARS-CoV-2 viral copies this assay can detect is 250 copies / mL. A negative result does not preclude SARS-CoV-2 infection and should not be used as the sole basis for treatment or other patient  management decisions.  A negative result may occur with improper specimen collection / handling, submission of specimen other than nasopharyngeal swab, presence of viral mutation(s) within the areas targeted by this assay, and inadequate number of viral copies (<250 copies / mL). A negative result must be combined with clinical observations, patient history, and epidemiological information.  Fact Sheet for Patients:   https://www.patel.info/  Fact Sheet for Healthcare Providers: https://hall.com/  This test is not yet approved or  cleared by the [place] FDA and has been authorized for detection and/or diagnosis of SARS-CoV-2 by FDA under an Emergency Use Authorization (EUA).  This EUA will remain in effect (meaning this test can be used) for the duration of the COVID-19 declaration under Section 564(b)(1) of the Act, 21 U.S.C. section 360bbb-3(b)(1), unless the authorization is terminated or revoked sooner.  Performed at [facility], [address] [place], [place] [address]     Labs: CBC: Recent Labs  Lab 01/15/22 1218 01/16/22 0605  WBC 4.9  4.3  HGB 11.5* 10.4*  HCT 37.2* 32.4*  MCV 100.0 97.3  PLT 134* 871*   Basic Metabolic Panel: Recent Labs  Lab 01/15/22 1218 01/16/22 0605  NA 138 135  K 4.2 4.1  CL 109 109  CO2 24 18*  GLUCOSE 111* 208*  BUN 25* 26*  CREATININE 2.24* 2.30*  CALCIUM 8.9 8.9   CBG: Recent Labs  Lab 01/12/22 0814  GLUCAP 88    Discharge time spent: greater than 30 minutes.  Signed: Donnamae Jude, MD [facility] 01/16/2022

## 2022-01-17 DIAGNOSIS — I10 Essential (primary) hypertension: Secondary | ICD-10-CM

## 2022-01-17 DIAGNOSIS — N184 Chronic kidney disease, stage 4 (severe): Secondary | ICD-10-CM | POA: Diagnosis not present

## 2022-01-17 DIAGNOSIS — R001 Bradycardia, unspecified: Secondary | ICD-10-CM | POA: Diagnosis not present

## 2022-01-17 DIAGNOSIS — C4A9 Merkel cell carcinoma, unspecified: Secondary | ICD-10-CM | POA: Diagnosis not present

## 2022-01-17 DIAGNOSIS — C7931 Secondary malignant neoplasm of brain: Secondary | ICD-10-CM | POA: Diagnosis not present

## 2022-01-17 MED ORDER — DIPHENOXYLATE-ATROPINE 2.5-0.025 MG/5ML PO LIQD: 5.0000 mL | Freq: Four times a day (QID) | ORAL | Status: DC | PRN

## 2022-01-17 MED ORDER — DIPHENOXYLATE-ATROPINE 2.5-0.025 MG PO TABS
1.0000 | ORAL_TABLET | Freq: Four times a day (QID) | ORAL | Status: AC | PRN
  Administered 2022-01-17 – 2022-01-19 (×4): 1 via ORAL
  Filled 2022-01-17 (×4): qty 1

## 2022-01-17 NOTE — Progress Notes (Signed)
  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  He has chronic kidney disease stage IV, HTN, COPD, BPH, dementia.  Patient has some mental status changes that neurology thinks may be related to his frontal lobe metastasis.  His overall mental state is about at his baseline.  He was seen by oncology as well as neurology during his hospitalization to ensure there was nothing other than probable metastases noted.  Neurology is in agreement with this diagnosis.  Further workup and radiation treatment as well as immunotherapy can be pursued as an outpatient.  Assessment and Plan: * Metastasis to brain [facility]) Patient is presenting with some mood changes per family, however I discussed with oncology and he is close to baseline at this time.  On examination, there are no focal neurological deficits.  MRI of the brain demonstrates multiple peripherally enhancing partially cystic masses concerning for metastatic disease.  Largest of these masses is 2.7 cm with some surrounding edema but no mass effect. - stable - Oncology consulted; recommended continued outpatient management, - Neurology consulted per oncology recommendations and attempt to ensure there is no infection nor cysticercosis noted and they are in agreement with the metastases diagnoses. - Status post Decadron IV 10 mg and now on Decadron 4 mg p.o.12/23--and continue - Frequent neurochecks Pending placement   Sinus bradycardia EKG with sinus bradycardia.  Given new brain mets with vasogenic edema, this was initial concerning, however per chart review, patient has a history of sinus bradycardia on several instances throughout this year.   Merkel cell cancer [facility]) - Oncology following; appreciate their  recommendations  CKD (chronic kidney disease) stage 4, GFR 15-29 ml/min (HCC) Renal function currently at baseline. Cr 2.3, at baseline  Hypertension - Continue home antihypertensives  COPD (chronic obstructive pulmonary disease) (HCC) No evidence of wheezing on examination and patient denies any pulmonary symptoms.  - Continue home bronchodilators        Subjective: resting in bed, watching TV, denies acute complaints. Requesting coffee from nursing station   Physical Exam: Vitals:   01/16/22 1208 01/16/22 1527 01/17/22 0620 01/17/22 0842  BP: 132/60 105/63 (!) 149/77 135/67  Pulse: 62 (!) 57 (!) 44 (!) 50  Resp: '16 20 18 16  '$ Temp: 98.4 F (36.9 C) 98.9 F (37.2 C) 98.6 F (37 C) 98 F (36.7 C)  TempSrc:   Oral Oral  SpO2: 98% 99% 98% 98%  Weight:      Height:       Physical Examination: General appearance - alert, well appearing, and in no distress Chest - clear to auscultation, no wheezes, rales or rhonchi, symmetric air entry Heart - normal rate, regular rhythm, normal S1, S2, no murmurs, rubs, clicks or gallops Abdomen - soft, nontender, nondistended, no masses or organomegaly  Data Reviewed: No results found for this or any previous visit (from the past 24 hour(s)).    Family Communication: Patient's sister-in-law, neurology spoke with his brother.  Disposition: Status is: Inpatient The patient will require care spanning > 2 midnights and should be moved to inpatient because: Unsafe discharge plan  Planned Discharge Destination: Skilled nursing facility    Time spent: 35 minutes  Author: Vanna Scotland, MD 01/17/2022 12:11 PM  For on call review [email or web address].

## 2022-01-18 DIAGNOSIS — C7931 Secondary malignant neoplasm of brain: Secondary | ICD-10-CM | POA: Diagnosis not present

## 2022-01-18 DIAGNOSIS — N184 Chronic kidney disease, stage 4 (severe): Secondary | ICD-10-CM | POA: Diagnosis not present

## 2022-01-18 DIAGNOSIS — C4A9 Merkel cell carcinoma, unspecified: Secondary | ICD-10-CM | POA: Diagnosis not present

## 2022-01-18 DIAGNOSIS — R001 Bradycardia, unspecified: Secondary | ICD-10-CM | POA: Diagnosis not present

## 2022-01-18 LAB — URINALYSIS, ROUTINE W REFLEX MICROSCOPIC
Bacteria, UA: NONE SEEN
Bilirubin Urine: NEGATIVE
Glucose, UA: NEGATIVE mg/dL
Hgb urine dipstick: NEGATIVE
Ketones, ur: NEGATIVE mg/dL
Leukocytes,Ua: NEGATIVE
Nitrite: NEGATIVE
Protein, ur: 30 mg/dL — AB
Specific Gravity, Urine: 1.009 (ref 1.005–1.030)
Squamous Epithelial / HPF: NONE SEEN (ref 0–5)
pH: 5 (ref 5.0–8.0)

## 2022-01-18 MED ORDER — OXYCODONE HCL 5 MG PO TABS
5.0000 mg | ORAL_TABLET | Freq: Four times a day (QID) | ORAL | Status: DC | PRN
  Administered 2022-01-18 – 2022-01-19 (×4): 5 mg via ORAL
  Filled 2022-01-18 (×4): qty 1

## 2022-01-18 NOTE — Progress Notes (Signed)
Assumed pt care, pt with no complaints

## 2022-01-18 NOTE — IPAL (Signed)
  Interdisciplinary Goals of Care Family Meeting   Date carried out: 01/18/2022  Location of the meeting: Bedside  Member's involved: Physician and Bedside Registered Nurse  Durable Power of Attorney or acting medical decision maker: brother Jason Lin also discussed this with patient and agrees he is would like to be DNR/DNI Jason Lin (power of attorney) [phone] Discussion: We discussed goals of care for Jason Lin .     Code status:   Code Status: DNR   Disposition: SNF/LTAC  Time spent for the meeting: [place], [place]  01/18/2022, 10:59 AM

## 2022-01-18 NOTE — Progress Notes (Signed)
Dr Alanda Amass, Ase[place] made aware that per night nurse pt wants to be a DNR, needs increase in pain med and possibly a palliative consult, and reported by night shift diarrhea x2, acknowledged

## 2022-01-18 NOTE — Progress Notes (Signed)
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  He has chronic kidney disease stage IV, HTN, COPD, BPH, dementia.  Patient has some mental status changes that neurology thinks may be related to his frontal lobe metastasis.  His overall mental state is about at his baseline.  He was seen by oncology as well as neurology during his hospitalization to ensure there was nothing other than probable metastases noted.  Neurology is in agreement with this diagnosis.  Further workup and radiation treatment as well as immunotherapy can be pursued as an outpatient.  Assessment and Plan: * Metastasis to brain [facility]) Patient is presenting with some mood changes per family, however I discussed with oncology and he is close to baseline at this time.  On examination, there are no focal neurological deficits.  MRI of the brain demonstrates multiple peripherally enhancing partially cystic masses concerning for metastatic disease.  Largest of these masses is 2.7 cm with some surrounding edema but no mass effect. - stable - Oncology consulted; recommended continued outpatient management, - Neurology consulted per oncology recommendations and attempt to ensure there is no infection nor cysticercosis noted and they are in agreement with the metastases diagnoses. - Status post Decadron IV 10 mg and now on Decadron 4 mg p.o.12/23--and continue - Frequent neurochecks Pending placement   Sinus bradycardia EKG with sinus bradycardia.  Given new brain mets with vasogenic edema, this was initial concerning, however per chart review, patient has a history of sinus bradycardia on several instances throughout this year.   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 Cr 2.3, at baseline  Hypertension - Continue home antihypertensives  COPD (chronic obstructive pulmonary disease) (HCC) No evidence of wheezing on examination and patient denies any pulmonary symptoms.  - Continue home bronchodilators        Subjective: Good discussion re pain control He seems to elect DNR today, but slightly confused states he wants intubation but "only for 30 minutes". I reinforced utility of advanced care measures> I introduced patient to palliative care and recommended ongoing discussion with their team re: GOCs and symptom management.  I Discussed with CCM re; placement, need for transportation to SNF and pending this. medically ready for dc   Physical Exam: Vitals:   01/17/22 1536 01/17/22 2156 01/18/22 0648 01/18/22 0927  BP: 131/68 126/66 138/70 108/81  Pulse: (!) 50 (!) 54 (!) 48 67  Resp: '18 18 18 20  '$ Temp: 98.5 F (36.9 C) 98.3 F (36.8 C) 98.2 F (36.8 C) 99.3 F (37.4 C)  TempSrc:      SpO2: 97% 98% 98% 97%  Weight:      Height:       Physical Examination: General appearance - alert, well appearing, and in no distress Chest - clear to auscultation, no wheezes, rales or rhonchi, symmetric air entry Heart - normal rate, regular rhythm, normal S1, S2, no murmurs, rubs, clicks or gallops Abdomen - soft, nontender, nondistended, no masses or organomegaly  Data Reviewed: Results for orders placed or performed during the hospital encounter of 01/15/22 (from the past 24 hour(s))  Urinalysis, Routine w reflex microscopic Urine, Clean Catch  Status: Abnormal   Collection Time: 01/18/22  1:31 AM  Result Value Ref Range   Color, Urine STRAW (A) YELLOW   APPearance CLEAR (A) CLEAR   Specific Gravity, Urine 1.009 1.005 - 1.030   pH 5.0 5.0 - 8.0   Glucose, UA NEGATIVE NEGATIVE mg/dL   Hgb urine dipstick NEGATIVE NEGATIVE    Bilirubin Urine NEGATIVE NEGATIVE   Ketones, ur NEGATIVE NEGATIVE mg/dL   Protein, ur 30 (A) NEGATIVE mg/dL   Nitrite NEGATIVE NEGATIVE   Leukocytes,Ua NEGATIVE NEGATIVE   WBC, UA 0-5 0 - 5 WBC/hpf   Bacteria, UA NONE SEEN NONE SEEN   Squamous Epithelial / LPF NONE SEEN 0 - 5   Mucus PRESENT       Family Communication: Patient's sister-in-law, neurology spoke with his brother.  Disposition: Status is: Inpatient The patient will require care spanning > 2 midnights and should be moved to inpatient because: Unsafe discharge plan  Planned Discharge Destination: Skilled nursing facility    Time spent: 35 minutes  Author: Vanna Scotland, MD 01/18/2022 10:44 AM  For on call review [email or web address].

## 2022-01-19 DIAGNOSIS — N184 Chronic kidney disease, stage 4 (severe): Secondary | ICD-10-CM

## 2022-01-19 DIAGNOSIS — Z515 Encounter for palliative care: Secondary | ICD-10-CM | POA: Diagnosis not present

## 2022-01-19 DIAGNOSIS — Z66 Do not resuscitate: Secondary | ICD-10-CM

## 2022-01-19 DIAGNOSIS — C7931 Secondary malignant neoplasm of brain: Secondary | ICD-10-CM | POA: Diagnosis not present

## 2022-01-19 DIAGNOSIS — C4A9 Merkel cell carcinoma, unspecified: Secondary | ICD-10-CM | POA: Diagnosis not present

## 2022-01-19 MED ORDER — OXYCODONE HCL 5 MG PO TABS
5.0000 mg | ORAL_TABLET | Freq: Four times a day (QID) | ORAL | Status: DC
  Administered 2022-01-19: 5 mg via ORAL
  Administered 2022-01-19 – 2022-01-20 (×3): 10 mg via ORAL
  Filled 2022-01-19: qty 1
  Filled 2022-01-19 (×3): qty 2

## 2022-01-19 NOTE — Care Management Important Message (Signed)
Important Message  Patient Details  Name: [name] MRN: [id number] Date of Birth: 10/06/1940   Medicare Important Message Given:  N/A - LOS <3 / Initial given by admissions     Juliann Pulse A Aubrei Bouchie 01/19/2022, 9:16 AM

## 2022-01-19 NOTE — Progress Notes (Signed)
  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  He has chronic kidney disease stage IV, HTN, COPD, BPH, dementia.  Patient has some mental status changes that neurology thinks may be related to his frontal lobe metastasis.  His overall mental state is about at his baseline.  He was seen by oncology as well as neurology during his hospitalization to ensure there was nothing other than probable metastases noted.  Neurology is in agreement with this diagnosis.  Further workup and radiation treatment as well as immunotherapy can be pursued as an outpatient.  Assessment and Plan: * Metastasis to brain [facility]) Patient is presenting with some mood changes per family, however I discussed with oncology and he is close to baseline at this time.  On examination, there are no focal neurological deficits.  MRI of the brain demonstrates multiple peripherally enhancing partially cystic masses concerning for metastatic disease.  Largest of these masses is 2.7 cm with some surrounding edema but no mass effect. - stable - Oncology consulted; recommended continued outpatient management, - Neurology consulted per oncology recommendations and attempt to ensure there is no infection nor cysticercosis noted and they are in agreement with the metastases diagnoses. - Status post Decadron IV 10 mg and now on Decadron 4 mg p.o.12/23--and continue - Frequent neurochecks Awaiting transportation back to [facility]--should be ready for DC tomorrow  Sinus bradycardia EKG with sinus bradycardia.  Given new brain mets with vasogenic edema, this was initial concerning, however per chart review, patient has a history of sinus bradycardia on several instances throughout this year.   Merkel cell cancer  [facility]) - Oncology following; appreciate their recommendations  CKD (chronic kidney disease) stage 4, GFR 15-29 ml/min (HCC) Renal function currently at baseline. Cr 2.3, at baseline Avoid nephrotoxic agents  Hypertension - Continue home antihypertensives  COPD (chronic obstructive pulmonary disease) (HCC) No evidence of wheezing on examination and patient denies any pulmonary symptoms.  - Continue home bronchodilators        Subjective: Patient is without complaint today.  Physical Exam: Vitals:   01/18/22 2021 01/19/22 0501 01/19/22 0805 01/19/22 0915  BP: 124/66 (!) 124/56 139/69   Pulse: (!) 50 96 (!) 49 (!) 56  Resp: '19 20 16   '$ Temp: 98.6 F (37 C) 98 F (36.7 C) 98.3 F (36.8 C)   TempSrc:  Oral    SpO2: 99% 99% 99%   Weight:      Height:       Physical Examination: General appearance - alert, well appearing, and in no distress Chest - normal effort Heart - normal rate and regular rhythm Extremities - no pedal edema noted  Data Reviewed: No results found for this or any previous visit (from the past 24 hour(s)).   Family Communication: patient at bedside  Disposition: Status is: Inpatient Remains inpatient appropriate because: unsafe discharge plan  Planned Discharge Destination: Skilled nursing facility    Time spent: 26 minutes  Author: Donnamae Jude, MD 01/19/2022 2:09 PM  For on call review [email or web address].

## 2022-01-19 NOTE — TOC Progression Note (Addendum)
Transition of Care [facility]) - Progression Note    Patient Details  Name: Jason Lin MRN: [id number] Date of Birth: 08-04-1940  Transition of Care [facility]) CM/SW [place], [place] Phone Number: 01/19/2022, 2:59 PM  Clinical Narrative:     TOC sent Fl2 to [facility]. TOC is calling [facility] at [phone].  TOC left a message for [facility] admissions and requested a phone call back.    Barriers to Discharge: Barriers Resolved  Expected Discharge Plan and [facility]     Expected Discharge Date: 01/16/22                                     Social Determinants of Health (SDOH) Interventions SDOH Screenings   Food Insecurity: No Food Insecurity (01/16/2022)  Housing: Low Risk  (01/16/2022)  Transportation Needs: No Transportation Needs (01/16/2022)  Utilities: Not At Risk (01/16/2022)  Tobacco Use: Low Risk  (01/15/2022)    Readmission Risk Interventions     No data to display

## 2022-01-19 NOTE — TOC Progression Note (Signed)
Transition of Care [facility]) - Progression Note    Patient Details  Name: Jason Lin MRN: [id number] Date of Birth: May 08, 1940  Transition of Care [facility]) CM/SW [place], [place] Phone Number: 01/19/2022, 11:51 AM  Clinical Narrative:     TOC left a message for [place] at [phone]. TOC is waiting to hear back from [facility] Admissions. Referral has been sent on the portal.     Barriers to Discharge: Barriers Resolved  Expected Discharge Plan and [place] [facility]   Expected Discharge Date: 01/16/22                                     Social Determinants of Health ([place]) Interventions [place]: No Food Insecurity (01/16/2022)  Housing: Low Risk  (01/16/2022)  Transportation Needs: No Transportation Needs (01/16/2022)  Utilities: Not At Risk (01/16/2022)  Tobacco Use: Low Risk  (01/15/2022)    Readmission Risk Interventions     No data to display

## 2022-01-19 NOTE — Consult Note (Signed)
Consultation Note Date: 01/19/2022 at [place]  Patient Name: Jason Lin  DOB: [birth year]  MRN: [id number]  Age / Sex: 81 y.o., male  PCP: Pcp, No Referring Physician: Donnamae Jude, MD  Reason for Consultation: Establishing goals of care  HPI/Patient Profile: 81 y.o. male  with past medical history of CKD (stage IV), HTN, BPH, COPD, dementia, and Merkel cell cancer with metastasis to brain s/p radiation admitted on 01/15/2022 with behavioral changes.  Oncology and neurology are following and consensus is that mental changes are related to frontal lobe metastasis.  Further workup (possible radiation treatment and immunotherapy) are planned to be pursued as an outpatient.  PMT was consulted to discuss goals of care.   Clinical Assessment and Goals of Care: I have reviewed medical records including EPIC notes, labs and imaging, assessed the patient and then met with patient at bedside to discuss diagnosis prognosis, GOC, EOL wishes, disposition and options.  I introduced Palliative Medicine as specialized medical care for people living with serious illness. It focuses on providing relief from the symptoms and stress of a serious illness. The goal is to improve quality of life for both the patient and the family.  We discussed a brief life review of the patient.  Patient shares she is from [place] and is currently married.  He shares his wife is not well and has been in a nursing home for about 3 years.  He shares his brother Laveda Abbe had a hip replacement and has some difficulty traveling as well.  However, he relies on [place] to help him make medical and life decisions.  As far as functional and nutritional status patient says he was doing well PTA.  We discussed patient's current illness and what it means in the larger context of patient's on-going co-morbidities.  In his own words, patient says he has "cancer in  my brain and they will kill me if they go in and try to take it out".  Natural disease trajectory discussed.  I attempted to elicit values and goals of care important to the patient.  Patient shares that he knows where he is going when the time comes and that the [facility] will take him when he is ready. He also says that he survived 47 rounds of radiation and it "almost killed me". We reviewed potential for additional radiation or immunotherapy treatments in the future. Patient became quiet and said, "no" but would not elaborate further. He defers discussion of his goals with his brother Laveda Abbe.   Reviewed symptom burden with patient.  Patient shares he feels well until he starts to get a headache and have pain.  Reviewed that patient is receiving Oxy IR every 6 hours as needed.  Patient shares that when he takes his pain medicine it manages his pain well.  However, he comments that the pain medicine wears off before he is able to get another dose.  Discussed medication management with RN Estill Bamberg and attending Dr. Kennon Rounds.  Recommended to switch Oxy IR to scheduled every 6 hours  and increase dose from 1-2 tabs (5-10 mg).  While in the patient's room, I spoke with patient's brother Laveda Abbe over the phone.  Laveda Abbe was concerned about patient's p.o. intake and overall well-being.  Brief medical update given. Discussed patient's appetite, importance of nutritional intake, and adjustment to pain medicine in order to more consistently cover patient's discomfort. Laveda Abbe was in agreement with these adjustments and shared he is hoping the patient can be discharged today.   Discussed with patient/[place]ec[place] the importance of continued conversation with family and the medical providers regarding overall plan of care and treatment options, ensuring decisions are within the context of the patient's values and GOCs.    As per attending, patient is likely going to be discharged today. He is appropriate for outpatient Palliative Care  services. Referral placed to TOC.   Questions and concerns were addressed. The family was encouraged to call with questions or concerns.   Primary Decision Maker NEXT OF KIN  Physical Exam Constitutional:      General: He is not in acute distress.    Appearance: He is normal weight. He is not ill-appearing.  HENT:     Head: Normocephalic.     Mouth/Throat:     Mouth: Mucous membranes are moist.  Eyes:     Pupils: Pupils are equal, round, and reactive to light.  Cardiovascular:     Rate and Rhythm: Normal rate.     Pulses: Normal pulses.  Pulmonary:     Effort: Pulmonary effort is normal.  Abdominal:     Palpations: Abdomen is soft.  Musculoskeletal:        General: Normal range of motion.     Comments: MAETC  Neurological:     Mental Status: He is alert and oriented to person, place, and time.  Psychiatric:        Mood and Affect: Mood normal.        Behavior: Behavior normal.        Thought Content: Thought content normal.        Judgment: Judgment normal.     Palliative Assessment/Data: 60%     Thank you for this consult. Palliative medicine will continue to follow and assist holistically.   Time Total: 75 minutes Greater than 50%  of this time was spent counseling and coordinating care related to the above assessment and plan.  Signed by: Jordan Hawks, DNP, FNP-BC Palliative Medicine    Please contact Palliative Medicine Team phone at [phone] for questions and concerns.  For individual provider: See Shea Evans

## 2022-01-19 NOTE — NC FL2 (Signed)
[place] LEVEL OF CARE FORM     IDENTIFICATION  Patient Name: Jason Lin Birthdate: Apr 19, 1940 Sex: male Admission Date (Current Location): 01/15/2022  [facility] and [place] Number:  Engineering geologist and Address:  [facility], [address] [place], [place] [address]      Provider Number: [id number]  Attending Physician Name and Address:  Donnamae Jude, MD  Relative Name and Phone Number:  Laveda Abbe [phone]    Current Level of Care: Domiciliary (Rest home) Recommended Level of Care: Assisted Living Facility Prior Approval Number:    Date Approved/Denied:   PASRR Number:    Discharge Plan: Domiciliary (Rest home)    Current Diagnoses: Patient Active Problem List   Diagnosis Date Noted   Malignant neoplasm metastatic to brain ([place]) 01/16/2022   Metastasis to brain ([place]) 01/15/2022   COPD (chronic obstructive pulmonary disease) ([place]) 01/15/2022   Hypokalemia 12/04/2021   Weight loss 12/04/2021   Sinus bradycardia 08/31/2021   Scrotal pain    Acute renal failure superimposed on chronic kidney disease ([place]) 07/21/2021   GERD (gastroesophageal reflux disease) 03/09/2021   Hx of echocardiogram 03/09/2021   Goals of care, counseling/discussion 03/09/2021   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   Stage 3b chronic kidney disease (CKD) ([place]) 02/25/2021   Chest pain 02/23/2021   Closed fracture sternum 02/23/2021   Axillary lymphadenopathy 02/23/2021   HTN (hypertension) 02/23/2021   Acute renal failure superimposed on stage 4 chronic kidney disease ([place]) 02/23/2021   Hypertension 02/23/2021   HLD (hyperlipidemia) 02/23/2021   Depression 02/23/2021   BPH (benign prostatic hyperplasia) 02/23/2021   Positive D-dimer    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[place])    Constipation 08/22/2020   B12 deficiency 08/07/2020   CKD (chronic kidney disease) stage 4, GFR 15-29 ml/min (HCC) 08/07/2020   Closed nondisplaced fracture of medial  cuneiform of right foot 08/07/2020   Compression fx, thoracic spine, closed, initial encounter ([place]) 08/07/2020   Altered mental status 07/31/2020   Acute diverticulitis 02/29/2020   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   Coronary atherosclerosis 07/18/2011   Anxiety 07/18/2011    Orientation RESPIRATION BLADDER Height & Weight     Self, Time, Situation, Place (poor attention and concentration)  Normal Continent Weight: 149 lb (67.6 kg) Height:  '5\' 5"'$  (165.1 cm)  BEHAVIORAL SYMPTOMS/MOOD NEUROLOGICAL BOWEL NUTRITION STATUS     (memory impairment, poor attention and concentration) Continent Diet (Regular)  AMBULATORY STATUS COMMUNICATION OF NEEDS Skin   Limited Assist Verbally Other (Comment) (dry/flaky)                       Personal Care Assistance Level of Assistance  Bathing, Feeding, Dressing Bathing Assistance: Limited assistance Feeding assistance: Independent Dressing Assistance: Limited assistance Total Care Assistance: Limited assistance   Functional Limitations Info  Sight, Hearing, Speech Sight Info: Adequate Hearing Info: Adequate Speech Info: Adequate    SPECIAL CARE FACTORS FREQUENCY                       Contractures Contractures Info: Not present    Additional Factors Info  Code Status, Allergies Code Status Info: DNR Allergies Info: Brimonidine, Timolol, iodinated contrast media           Current Medications (01/19/2022):  This is the current hospital active medication list Current Facility-Administered Medications  Medication Dose Route Frequency Provider Last Rate Last Admin   acetaminophen (TYLENOL) tablet 650 mg  650 mg Oral  Q6H PRN Jose Persia, MD   650 mg at 01/18/22 0455   Or   acetaminophen (TYLENOL) suppository 650 mg  650 mg Rectal Q6H PRN Jose Persia, MD       albuterol (PROVENTIL) (2.5 MG/3ML) 0.083% nebulizer solution 2.5 mg  2.5 mg Nebulization Q6H PRN Alison Murray, RPH        atropine 1 % ophthalmic solution 1 drop  1 drop Left Eye TID Jose Persia, MD   1 drop at 01/19/22 1440   dexamethasone (DECADRON) tablet 4 mg  4 mg Oral Daily Jose Persia, MD   4 mg at 01/19/22 0824   dorzolamide-timolol (COSOPT) 2-0.5 % ophthalmic solution 1 drop  1 drop Both Eyes Q12H Jose Persia, MD   1 drop at 01/19/22 0830   enoxaparin (LOVENOX) injection 30 mg  30 mg Subcutaneous Q24H Jose Persia, MD   30 mg at 01/16/22 1955   FLUoxetine (PROZAC) capsule 20 mg  20 mg Oral Daily Jose Persia, MD   20 mg at 01/19/22 0824   latanoprost (XALATAN) 0.005 % ophthalmic solution 1 drop  1 drop Both Eyes QHS Jose Persia, MD   1 drop at 01/18/22 2256   medroxyPROGESTERone (PROVERA) tablet 5 mg  5 mg Oral BID Jose Persia, MD   5 mg at 01/19/22 0825   melatonin tablet 5 mg  5 mg Oral QHS Jose Persia, MD   5 mg at 01/18/22 2259   metoprolol succinate (TOPROL-XL) 24 hr tablet 25 mg  25 mg Oral Daily Jose Persia, MD   25 mg at 01/19/22 0824   mometasone-formoterol (DULERA) 100-5 MCG/ACT inhaler 2 puff  2 puff Inhalation BID Jose Persia, MD   2 puff at 01/19/22 0824   ondansetron (ZOFRAN) tablet 4 mg  4 mg Oral Q6H PRN Jose Persia, MD   4 mg at 01/18/22 9735   Or   ondansetron (ZOFRAN) injection 4 mg  4 mg Intravenous Q6H PRN Jose Persia, MD       oxyCODONE (Oxy IR/ROXICODONE) immediate release tablet 5-10 mg  5-10 mg Oral Q6H Jordan Hawks, FNP       pantoprazole (PROTONIX) EC tablet 40 mg  40 mg Oral Daily Jose Persia, MD   40 mg at 01/19/22 0824   polyethylene glycol (MIRALAX / GLYCOLAX) packet 17 g  17 g Oral Daily PRN Jose Persia, MD       potassium chloride SA (KLOR-CON M) CR tablet 20 mEq  20 mEq Oral Daily Jose Persia, MD   20 mEq at 01/19/22 0824   prednisoLONE acetate (PRED FORTE) 1 % ophthalmic suspension 1 drop  1 drop Left Eye 6 X Daily Jose Persia, MD   1 drop at 01/19/22 1439   psyllium (HYDROCIL/METAMUCIL) 1 packet  1 packet Oral  Daily Alison Murray, RPH   1 packet at 01/18/22 3299   simvastatin (ZOCOR) tablet 20 mg  20 mg Oral q1800 Jose Persia, MD   20 mg at 01/18/22 1746   tamsulosin (FLOMAX) capsule 0.4 mg  0.4 mg Oral QPC supper Jose Persia, MD   0.4 mg at 01/18/22 1746   traZODone (DESYREL) tablet 100 mg  100 mg Oral QHS Jose Persia, MD   100 mg at 01/18/22 2259     Discharge Medications: Please see discharge summary for a list of discharge medications.  Relevant Imaging Results:  Relevant Lab Results:   Additional Information SSN: [id number]  Colen Darling, LCSWA

## 2022-01-20 DIAGNOSIS — Z515 Encounter for palliative care: Secondary | ICD-10-CM | POA: Diagnosis not present

## 2022-01-20 DIAGNOSIS — N184 Chronic kidney disease, stage 4 (severe): Secondary | ICD-10-CM | POA: Diagnosis not present

## 2022-01-20 DIAGNOSIS — C7931 Secondary malignant neoplasm of brain: Secondary | ICD-10-CM | POA: Diagnosis not present

## 2022-01-20 DIAGNOSIS — C4A9 Merkel cell carcinoma, unspecified: Secondary | ICD-10-CM | POA: Diagnosis not present

## 2022-01-20 MED ORDER — OXYCODONE HCL 5 MG PO TABS: 2.5000 mg | ORAL_TABLET | Freq: Four times a day (QID) | ORAL | 0 refills | Status: DC | PRN

## 2022-01-20 MED ORDER — ALUM & MAG HYDROXIDE-SIMETH 200-200-20 MG/5ML PO SUSP
15.0000 mL | Freq: Four times a day (QID) | ORAL | Status: DC | PRN
  Administered 2022-01-20: 15 mL via ORAL
  Filled 2022-01-20: qty 30

## 2022-01-20 MED ORDER — DEXAMETHASONE 4 MG PO TABS: 4.0000 mg | ORAL_TABLET | Freq: Every day | ORAL | 0 refills | Status: DC

## 2022-01-20 NOTE — Progress Notes (Signed)
   01/20/22 1051  Medical Necessity for Transport Certificate --- IF THIS TRANSPORT IS ROUND TRIP OR SCHEDULED AND REPEATED, A PHYSICIAN MUST COMPLETE THIS FORM  Transport from: Teacher, English as a foreign language) Doctor, hospital to (Location) Other (Comment) ([place])  Did the patient arrive from a [place], [place] or Group Home? Yes  [place] Name Other (enter name of facility below) [facility])  [place] Name [place]  Is this the closest appropriate facility? Yes  Date of Transport Service 01/20/22  Name of [facility]  Round Trip Transport? No  Reason for Transport Discharge  Is this a hospice patient? 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 1. Patient unable to get up from bed without assistance  AND  2. Unable to ambulate  AND  3. Unable to sit in a chair, including wheelchair. Yes  Q2 Could the patient be transported safely by other means of transportation (I.E., wheelchair van)? No  Q3 Please check any of the following conditions that apply at the time of transport: Altered mental status;Risk of injury to self and/or others  Electronic Signature Colen Darling  Credentials DP  Date Signed 01/20/22

## 2022-01-20 NOTE — Progress Notes (Signed)
Palliative Care Progress Note, Assessment & Plan   Patient Name: Jason Lin       Date: 01/20/2022 DOB: [birth year]  Age: 81 y.o. MRN#: [id number] Attending Physician: Caren Griffins, MD Primary Care Physician: Pcp, No Admit Date: 01/15/2022  Subjective: Patient is standing up and looking out of the window.  He is able to acknowledge my presence and make his wishes known.  He is requesting that someone place his lunch order.  No family or friends present at bedside.  HPI: 81 y.o. male  with past medical history of CKD (stage IV), HTN, BPH, COPD, dementia, and Merkel cell cancer with metastasis to brain s/p radiation admitted on 01/15/2022 with behavioral changes.  Oncology and neurology are following and consensus is that mental changes are related to frontal lobe metastasis.  Further workup (possible radiation treatment and immunotherapy) are planned to be pursued as an outpatient.   PMT was consulted to discuss goals of care.  Summary of counseling/coordination of care: After reviewing the patient's chart and assessing the patient at bedside, I spoke with patient regards to plan of care and symptom management.  Specifically, we discussed that patient's pain medication had been switched to scheduled from as needed.  Dosage was also available for 5 to 10 mg.  Patient endorses his pain is better controlled.  He shares it is never going to go away since "I have cancer in my brain".  However, he endorses he is not experiencing any pain and is minimal at a 2 out of 10 at the moment.  Discussed plan of care with patient.  He shares he will be discharged today and is "looking forward to it".   I discussed importance of patient communicating his symptom burden to caretakers.  Discussed that pain medicine  will be available but that patient needs to be clear about when pain has increased and when needs additional doses for breakthrough pain.  Patient shares he just wants to get his lunch and go home.  Patient set for discharge to [place] today.  No further acute palliative needs at this time.  I have recommended and placed referral for patient to be followed by outpatient palliative services at discharge.  Physical Exam Vitals reviewed.  Constitutional:      General: He is not in acute distress.    Appearance: He is normal weight. He is not ill-appearing.  HENT:     Head: Normocephalic.     Mouth/Throat:     Mouth: Mucous membranes are moist.  Eyes:     Pupils: Pupils are equal, round, and reactive to light.  Cardiovascular:     Rate and Rhythm: Normal rate.     Pulses: Normal pulses.  Pulmonary:     Effort: Pulmonary effort is normal.  Abdominal:     Palpations: Abdomen is soft.  Musculoskeletal:        General: Normal range of motion.     Comments: MAETC  Neurological:     Mental Status: He is alert and oriented to person, place, and time.  Psychiatric:        Mood and Affect: Mood normal.        Behavior: Behavior normal.  Thought Content: Thought content normal.        Judgment: Judgment normal.             Palliative Assessment/Data: 70%    Total Time 25 minutes   Thank you for allowing the Palliative Medicine Team to assist in the care of this patient.  [place] Ilsa Iha, FNP-BC Palliative Medicine Team Team Phone # [phone]

## 2022-01-20 NOTE — Discharge Summary (Addendum)
Physician Discharge Summary  Jason Lin LKG:[id number] DOB: June 28, 1940 DOA: 01/15/2022  PCP: Pcp, No  Admit date: 01/15/2022 Discharge date: 01/20/2022  Admitted From: ALF Disposition:  ALF  Recommendations for Outpatient Follow-up:  Follow up with PCP in 1-2 weeks Please obtain BMP/CBC in one week Follow up with oncology in 1-2 weeks Outpatient follow-up with palliative as well  Discharge Condition: stable CODE STATUS: DNR Diet Orders (From admission, onward)     Start     Ordered   01/16/22 0000  Diet - low sodium heart healthy        01/16/22 1603   01/15/22 1846  Diet regular Room service appropriate? Yes; Fluid consistency: Thin  Diet effective now       Question Answer Comment  Room service appropriate? Yes   Fluid consistency: Thin      01/15/22 1845            HPI: Per admitting MD, Jason Lin is a 81 y.o. 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  History obtained from patient and through chart review given patient's underlying history of dementia. Jason Lin states that he is experiencing some pain at this time that is chronic in nature and asking for his home pain medication.  He states otherwise, he is not having any complaints including focal weakness.  He denies any chest pain, difficulty breathing. Per chart review, patient recently had a CT scan that showed mass in the brain.  Given concern for metastatic Merkel cell cancer, or I brain with and without was recommended.  Given brother's concern for mood changes, he was recommended to go to the ED for immediate evaluation.  Hospital Course / Discharge diagnoses: Principal Problem:   Metastasis to brain [facility]) Active Problems:   Sinus bradycardia   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HCC)   CKD (chronic kidney disease) stage 4, GFR 15-29 ml/min (HCC)    Hypertension   COPD (chronic obstructive pulmonary disease) (HCC)   Malignant neoplasm metastatic to brain [facility])  Principal problem Metastasis to brain [facility]) -Patient is presenting with some mood changes per family, however per prior hospitalist discussion with oncology he appears close to baseline.  There are no focal neurologic deficits, and he is ambulating with a walker.  MRI of the brain on admission showed metastatic disease, oncology consulted, recommended outpatient management and for now he has been placed on steroids.  He is stable, will be discharged back to his assisted living facility with close outpatient follow-up with oncology  Active problems Sinus bradycardia -EKG with sinus bradycardia.  Given new brain mets with vasogenic edema, this was initial concerning, however per chart review, patient has a history of sinus bradycardia on several instances throughout this year.  Merkel cell cancer [facility]) - Oncology following; appreciate their recommendations CKD (chronic kidney disease) stage 4, GFR 15-29 ml/min (HCC) -Renal function currently at baseline. Hypertension -Continue home antihypertensives COPD (chronic obstructive pulmonary disease) (HCC) -No evidence of wheezing on examination and patient denies any pulmonary symptoms.   Sepsis ruled out   Discharge Instructions  Discharge Instructions     Call MD for:  persistant dizziness or light-headedness   Complete by: As directed    Call MD for:  persistant nausea and vomiting   Complete by: As directed    Call MD for:  severe uncontrolled pain   Complete by:  As directed    Call MD for:  temperature >100.4   Complete by: As directed    Diet - low sodium heart healthy   Complete by: As directed    Increase activity slowly   Complete by: As directed       Allergies as of 01/20/2022       Reactions   Brimonidine Itching   Timolol Itching   Iodinated Contrast Media Other (See Comments)        Medication List      TAKE these medications    acetaminophen 500 MG tablet Commonly known as: TYLENOL Take 1,000 mg by mouth every 8 (eight) hours as needed.   Advair Diskus 100-50 MCG/ACT Aepb Generic drug: fluticasone-salmeterol Inhale 1 puff into the lungs 2 (two) times daily.   albuterol 108 (90 Base) MCG/ACT inhaler Commonly known as: VENTOLIN HFA Inhale 2 puffs into the lungs every 6 (six) hours as needed for wheezing or shortness of breath.   atropine 1 % ophthalmic solution Place 1 drop into the left eye 3 (three) times daily.   Biofreeze 4 % Gel Generic drug: Menthol (Topical Analgesic) Apply 1 Application topically in the morning, at noon, and at bedtime.   bisacodyl 10 MG suppository Commonly known as: DULCOLAX Place 10 mg rectally every three (3) days as needed for moderate constipation.   calcium carbonate 500 MG chewable tablet Commonly known as: TUMS - dosed in mg elemental calcium Chew 1 tablet by mouth 2 (two) times daily.   cyanocobalamin 1000 MCG tablet Commonly known as: VITAMIN B12 Take 1,000 mcg by mouth daily.   dexamethasone 4 MG tablet Commonly known as: DECADRON Take 1 tablet (4 mg total) by mouth daily.   dorzolamide-timolol 2-0.5 % ophthalmic solution Commonly known as: COSOPT Place 1 drop into both eyes every 12 (twelve) hours.   FLUoxetine 20 MG capsule Commonly known as: PROZAC Take 20 mg by mouth daily.   fluticasone 50 MCG/ACT nasal spray Commonly known as: FLONASE Place 2 sprays into both nostrils daily.   guaiFENesin 100 MG/5ML liquid Commonly known as: ROBITUSSIN Take 10 mLs by mouth every 4 (four) hours as needed for cough or to loosen phlegm.   latanoprost 0.005 % ophthalmic solution Commonly known as: XALATAN Place 1 drop into both eyes at bedtime.   loperamide 2 MG tablet Commonly known as: IMODIUM A-D Take 1 mg by mouth 2 (two) times daily as needed for diarrhea or loose stools.   medroxyPROGESTERone 5 MG tablet Commonly known as:  PROVERA Take 5 mg by mouth in the morning and at bedtime.   melatonin 3 MG Tabs tablet Take 6 mg by mouth at bedtime.   metoprolol succinate 25 MG 24 hr tablet Commonly known as: TOPROL-XL Take 25 mg by mouth daily.   nitroGLYCERIN 0.4 MG SL tablet Commonly known as: NITROSTAT Place 0.4 mg under the tongue every 5 (five) minutes as needed for chest pain.   oxyCODONE 5 MG immediate release tablet Commonly known as: Oxy IR/ROXICODONE Take 0.5 tablets (2.5 mg total) by mouth every 6 (six) hours as needed for severe pain.   pantoprazole 40 MG tablet Commonly known as: PROTONIX Take 40 mg by mouth daily.   potassium chloride SA 20 MEQ tablet Commonly known as: KLOR-CON M Take 20 mEq by mouth daily.   prednisoLONE acetate 1 % ophthalmic suspension Commonly known as: PRED FORTE Place 1 drop into the left eye 6 (six) times daily.   psyllium 0.52 g capsule Commonly known  as: REGULOID Take 0.52 g by mouth daily.   simvastatin 20 MG tablet Commonly known as: ZOCOR Take 20 mg by mouth daily.   tamsulosin 0.4 MG Caps capsule Commonly known as: FLOMAX Take 0.4 mg by mouth daily after supper.   traZODone 100 MG tablet Commonly known as: DESYREL Take 100 mg by mouth at bedtime.        Follow-up Information     Earlie Server, MD Follow up in 1 week(s).   Specialty: Oncology Why: Hospital follow-up Contact information: [place] [address] [phone]                 Consultations: Oncology Neurology  Procedures/Studies:  MR Brain W and Wo Contrast  Result Date: 01/15/2022 CLINICAL DATA:  Metastatic disease evaluation EXAM: MRI HEAD WITHOUT AND WITH CONTRAST TECHNIQUE: Multiplanar, multiecho pulse sequences of the brain and surrounding structures were obtained without and with intravenous contrast. CONTRAST:  7.26m GADAVIST GADOBUTROL 1 MMOL/ML IV SOLN COMPARISON:  CT head from the same day. FINDINGS: Brain: Multiple peripherally enhancing,  partially cystic masses, compatible with metastatic disease. The largest lesion measures 2.7 cm in the high left frontal parietal region (series 18, image 118). Additional 5 mm lesion right occipital lobe (series 18, image 66) and 5 mm lesion in the right cerebellum (series 18, image 51). Edema surrounding the dominant lesion. No significant mass effect. No midline shift. No evidence of infarct, acute hemorrhage, or hydrocephalus. Vascular: Major arterial flow voids are maintained at the skull base. Skull and upper cervical spine: Normal marrow signal. Sinuses/Orbits: Clear sinuses.  No acute orbital findings. Other: No mastoid effusions. IMPRESSION: Multiple peripherally enhancing, partially cystic masses, detailed above and suspicious for metastatic disease. Electronically Signed   By: FMargaretha SheffieldM.D.   On: 01/15/2022 15:20   CT Head Wo Contrast  Result Date: 01/15/2022 CLINICAL DATA:  Altered mental status EXAM: CT HEAD WITHOUT CONTRAST TECHNIQUE: Contiguous axial images were obtained from the base of the skull through the vertex without intravenous contrast. RADIATION DOSE REDUCTION: This exam was performed according to the departmental dose-optimization program which includes automated exposure control, adjustment of the mA and/or kV according to patient size and/or use of iterative reconstruction technique. COMPARISON:  None Available. FINDINGS: Brain: No acute intracranial findings are seen. There are no signs of bleeding within the cranium. Cortical sulci are prominent. There is 2.3 cm area of fluid density in left frontoparietal cortex. There is mild vasogenic edema adjacent to this fluid density lesion. There is no effacement of adjacent cortical sulci. Vascular: Unremarkable. Skull: Unremarkable. Sinuses/Orbits: There is mild mucosal thickening in ethmoid and maxillary sinuses. Other: None. IMPRESSION: There are no signs of bleeding within the cranium. There is 2.3 cm fluid density lesion with  adjacent vasogenic edema in the left frontoparietal cortex. Possibility of a space-occupying lesion such as necrotic neoplasm should be considered. Follow-up MRI without and with contrast enhancement may be considered. Electronically Signed   By: PElmer PickerM.D.   On: 01/15/2022 14:07   DG Chest 2 View  Result Date: 01/15/2022 CLINICAL DATA:  Chest EXAM: CHEST - 2 VIEW COMPARISON:  CXR 11/02/21 FINDINGS: No pleural effusion. No pneumothorax. No focal airspace opacity. Normal cardiac and mediastinal contours. No displaced rib fractures. Visualized upper abdomen is unremarkable. Degenerative changes of the bilateral AC joints. IMPRESSION: No active cardiopulmonary disease. Electronically Signed   By: HMarin RobertsM.D.   On: 01/15/2022 12:39   NM PET Image Restage (PS) Whole  Body  Addendum Date: 01/15/2022   ADDENDUM REPORT: 01/15/2022 10:39 ADDENDUM: Findings conveyed toZHOU YU on 01/15/2022  at10:25. Electronically Signed   By: Suzy Bouchard M.D.   On: 01/15/2022 10:39   Result Date: 01/15/2022 CLINICAL DATA:  Subsequent treatment strategy for Merkel cell carcinoma. EXAM: NUCLEAR MEDICINE PET WHOLE BODY TECHNIQUE: 7.7 mCi F-18 FDG was injected intravenously. Full-ring PET imaging was performed from the head to foot after the radiotracer. CT data was obtained and used for attenuation correction and anatomic localization. Fasting blood glucose: 88 mg/dl COMPARISON:  CT 11/02/2021 abdomen pelvis, PET-CT scan 03/03/2021 FINDINGS: Mediastinal blood pool activity: SUV max 2.0 HEAD/NECK: Within the high LEFT frontal lobe there is a region of relative hypometabolism (image number 19 the PET data set). There is a corresponding subtle region hypodensity on accompanying CT (image 17/series 2. No hypermetabolic intense lesions or adenopathy Incidental CT findings: none CHEST: No hypermetabolic mediastinal or hilar nodes. No suspicious pulmonary nodules on the CT scan. Incidental CT findings: none  ABDOMEN/PELVIS: Within the central mesentery 16 mm 14 mm lymph node is mildly hypermetabolic SUV max equal 4.1 (image 22). Smaller aortocaval node with mild metabolic activity (SUV max equal 2 point on image 20. No pelvic adenopathy. No abnormal metabolic activity liver Incidental CT findings: none SKELETON: No focal hypermetabolic activity to suggest skeletal metastasis. No cutaneous lesion. Incidental CT findings: none EXTREMITIES: No abnormal hypermetabolic activity in the lower extremities. No cutaneous lesion. Activity in the RIGHT hand related to radiotracer injection. Incidental CT findings: none IMPRESSION: 1. Relatively Hypometabolic lesion in high LEFT frontal lobe is concerning for a brain metastasis. Recommend MRI of the brain for further evaluation. 2. No evidence of Markel cell carcinoma elsewhere on whole-body scan. 3. Hypermetabolic central mesenteric and periaortic lymph nodes. Favor low-grade lymphoproliferative disorder or reactive adenopathy. Merkel cell carcinoma not favored but not excluded. Electronically Signed: By: Suzy Bouchard M.D. On: 01/15/2022 08:43     Subjective: - no chest pain, shortness of breath, no abdominal pain, nausea or vomiting.   Discharge Exam: BP 134/66 (BP Location: Left Arm)   Pulse (!) 52   Temp 97.9 F (36.6 C)   Resp 18   Ht '5\' 5"'$  (1.651 m)   Wt 67.6 kg   SpO2 98%   BMI 24.79 kg/m   General: Pt is alert, awake, not in acute distress Cardiovascular: RRR, S1/S2 +, no rubs, no gallops Respiratory: CTA bilaterally, no wheezing, no rhonchi Abdominal: Soft, NT, ND, bowel sounds + Extremities: no edema, no cyanosis    The results of significant diagnostics from this hospitalization (including imaging, microbiology, ancillary and laboratory) are listed below for reference.     Microbiology: No results found for this or any previous visit (from the past 240 hour(s)).   Labs: Basic Metabolic Panel: Recent Labs  Lab 01/15/22 1218  01/16/22 0605  NA 138 135  K 4.2 4.1  CL 109 109  CO2 24 18*  GLUCOSE 111* 208*  BUN 25* 26*  CREATININE 2.24* 2.30*  CALCIUM 8.9 8.9   Liver Function Tests: No results for input(s): "AST", "ALT", "ALKPHOS", "BILITOT", "PROT", "ALBUMIN" in the last 168 hours. CBC: Recent Labs  Lab 01/15/22 1218 01/16/22 0605  WBC 4.9 4.3  HGB 11.5* 10.4*  HCT 37.2* 32.4*  MCV 100.0 97.3  PLT 134* 122*   CBG: No results for input(s): "GLUCAP" in the last 168 hours. Hgb A1c No results for input(s): "HGBA1C" in the last 72 hours. Lipid Profile No results for  input(s): "CHOL", "HDL", "LDLCALC", "TRIG", "CHOLHDL", "LDLDIRECT" in the last 72 hours. Thyroid function studies No results for input(s): "TSH", "T4TOTAL", "T3FREE", "THYROIDAB" in the last 72 hours.  Invalid input(s): "FREET3" Urinalysis    Component Value Date/Time   COLORURINE STRAW (A) 01/18/2022 0131   APPEARANCEUR CLEAR (A) 01/18/2022 0131   LABSPEC 1.009 01/18/2022 0131   PHURINE 5.0 01/18/2022 0131   GLUCOSEU NEGATIVE 01/18/2022 0131   HGBUR NEGATIVE 01/18/2022 0131   BILIRUBINUR NEGATIVE 01/18/2022 0131   KETONESUR NEGATIVE 01/18/2022 0131   PROTEINUR 30 (A) 01/18/2022 0131   NITRITE NEGATIVE 01/18/2022 0131   LEUKOCYTESUR NEGATIVE 01/18/2022 0131    FURTHER DISCHARGE INSTRUCTIONS:   Get Medicines reviewed and adjusted: Please take all your medications with you for your next visit with your Primary MD   Laboratory/radiological data: Please request your Primary MD to go over all hospital tests and procedure/radiological results at the follow up, please ask your Primary MD to get all Hospital records sent to his/her office.   In some cases, they will be blood work, cultures and biopsy results pending at the time of your discharge. Please request that your primary care M.D. goes through all the records of your hospital data and follows up on these results.   Also Note the following: If you experience worsening of  your admission symptoms, develop shortness of breath, life threatening emergency, suicidal or homicidal thoughts you must seek medical attention immediately by calling 911 or calling your MD immediately  if symptoms less severe.   You must read complete instructions/literature along with all the possible adverse reactions/side effects for all the Medicines you take and that have been prescribed to you. Take any new Medicines after you have completely understood and accpet all the possible adverse reactions/side effects.    Do not drive when taking Pain medications or sleeping medications (Benzodaizepines)   Do not take more than prescribed Pain, Sleep and Anxiety Medications. It is not advisable to combine anxiety,sleep and pain medications without talking with your primary care practitioner   Special Instructions: If you have smoked or chewed Tobacco  in the last 2 yrs please stop smoking, stop any regular Alcohol  and or any Recreational drug use.   Wear Seat belts while driving.   Please note: You were cared for by a hospitalist during your hospital stay. Once you are discharged, your primary care physician will handle any further medical issues. Please note that NO REFILLS for any discharge medications will be authorized once you are discharged, as it is imperative that you return to your primary care physician (or establish a relationship with a primary care physician if you do not have one) for your post hospital discharge needs so that they can reassess your need for medications and monitor your lab values.  Time coordinating discharge: 40 minutes  SIGNED:  Marzetta Board, MD, PhD 01/20/2022, 9:26 AM

## 2022-01-20 NOTE — Progress Notes (Signed)
Patient being discharged back to [facility]. PIV removed. All paper work in discharge packet including printed prescriptions and DNR form. Patient being transported via EMS.

## 2022-01-20 NOTE — TOC Progression Note (Addendum)
Transition of Care [facility]) - Progression Note    Patient Details  Name: Jason Lin MRN: [id number] Date of Birth: 08-Oct-1940  Transition of Care [facility]) CM/SW [place], [place] Phone Number: 01/20/2022, 9:55 AM  Clinical Narrative:     TOC left a voicemail for Amy at [facility] and emailed her at [email or web address]. TOC informed staff that this patient is ready for discharge.  TOC left a message for admissions at [phone].   Barriers to Discharge: Barriers Resolved  Expected Discharge Plan and Services         Expected Discharge Date: 01/20/22                                     Social Determinants of Health (SDOH) Interventions SDOH Screenings   Food Insecurity: No Food Insecurity (01/16/2022)  Housing: Low Risk  (01/16/2022)  Transportation Needs: No Transportation Needs (01/16/2022)  Utilities: Not At Risk (01/16/2022)  Tobacco Use: Low Risk  (01/15/2022)    Readmission Risk Interventions     No data to display

## 2022-01-20 NOTE — Care Management Important Message (Signed)
Important Message  Patient Details  Name: Jason Lin MRN: [id number] Date of Birth: [birth year]   Medicare Important Message Given:  Yes     Juliann Pulse A Chakita Mcgraw 01/20/2022, 10:34 AM

## 2022-01-20 NOTE — TOC Transition Note (Addendum)
Transition of Care [facility]) - CM/SW Discharge Note   Patient Details  Name: Jason Lin MRN: [id number] Date of Birth: 1940/09/21  Transition of Care [facility]) CM/SW Contact:  Colen Darling, [place] Phone Number: 01/20/2022, 10:50 AM   Clinical Narrative:     TOC spoke to the patient's family member and this patient will transfer to [facility] today.   TOC asked [facility] to review for palliative at the SNF.  Final next level of care: Assisted Living Barriers to Discharge: Barriers Resolved   Patient Goals and CMS Choice      Discharge Placement                Patient chooses bed at:  [facility]) Patient to be transferred to facility by: [facility] Name of family member notified: Laveda Abbe [phone] Patient and family notified of of transfer: 01/20/22  Discharge Plan and Services Additional resources added to the After Visit Summary for                                       Social Determinants of Health (SDOH) Interventions SDOH Screenings   Food Insecurity: No Food Insecurity (01/16/2022)  Housing: Low Risk  (01/16/2022)  Transportation Needs: No Transportation Needs (01/16/2022)  Utilities: Not At Risk (01/16/2022)  Tobacco Use: Low Risk  (01/15/2022)     Readmission Risk Interventions     No data to display

## 2022-01-20 NOTE — Progress Notes (Signed)
[facility] answered phone and is available to accept patient. Report given. ALF and brother unable to transport. EMS called for transport. TOC and RN aware. Orma Flaming, RN

## 2022-01-21 ENCOUNTER — Telehealth: Payer: Self-pay

## 2022-01-21 DIAGNOSIS — G9389 Other specified disorders of brain: Secondary | ICD-10-CM

## 2022-01-21 DIAGNOSIS — C4A9 Merkel cell carcinoma, unspecified: Secondary | ICD-10-CM

## 2022-01-21 NOTE — Telephone Encounter (Signed)
-----   Message from Earlie Server, MD sent at 01/20/2022  8:28 AM EST ----- He will be discharged soon. Brain mets. Please arrange him to see Dr.Chyrstal for brain RT.  Follow up with me in 2-3 weeks. Thanks. MD only

## 2022-01-21 NOTE — Telephone Encounter (Signed)
Contacted Jason Lin [facility]) and informed him of MD recommendation. He verbalized understanding.   Pt already scheduled for MD on 1/3 (will keep as is).   Please arrange pt to see Dr. [name] for brain RT.   Per POA request, please inform [facility] of appts. if they are unable to provide transportation, then contact [name] [phone] see if she can bring pt to appts. Then he would like a call back with appt details and to see who will be taking pt to appts.

## 2022-01-27 ENCOUNTER — Ambulatory Visit: Payer: Medicare Other | Attending: Radiation Oncology | Admitting: Radiation Oncology

## 2022-01-27 ENCOUNTER — Telehealth: Payer: Self-pay | Admitting: Oncology

## 2022-01-27 ENCOUNTER — Inpatient Hospital Stay: Payer: Medicare PPO | Admitting: Oncology

## 2022-01-27 NOTE — Telephone Encounter (Signed)
Spoke with pt brother whos stated that he recieved message that pt was scheduled today, but lady who does transportation said that he did not have appt.please advise if pt needs to be r/s

## 2022-01-27 NOTE — Telephone Encounter (Signed)
Please also r/s consult with Dr. [name], we will give pt appt details tomorrow when he is here.

## 2022-01-28 ENCOUNTER — Inpatient Hospital Stay: Payer: Medicare PPO | Attending: Oncology | Admitting: Oncology

## 2022-01-28 ENCOUNTER — Non-Acute Institutional Stay: Payer: Medicare PPO | Admitting: Hospice

## 2022-01-28 ENCOUNTER — Encounter: Payer: Self-pay | Admitting: Oncology

## 2022-01-28 VITALS — BP 125/88 | HR 61 | Temp 99.4°F | Resp 18 | Wt 150.7 lb

## 2022-01-28 DIAGNOSIS — Z7952 Long term (current) use of systemic steroids: Secondary | ICD-10-CM | POA: Diagnosis not present

## 2022-01-28 DIAGNOSIS — G4700 Insomnia, unspecified: Secondary | ICD-10-CM

## 2022-01-28 DIAGNOSIS — C4A9 Merkel cell carcinoma, unspecified: Secondary | ICD-10-CM

## 2022-01-28 DIAGNOSIS — Z79899 Other long term (current) drug therapy: Secondary | ICD-10-CM | POA: Diagnosis not present

## 2022-01-28 DIAGNOSIS — Z923 Personal history of irradiation: Secondary | ICD-10-CM | POA: Diagnosis not present

## 2022-01-28 DIAGNOSIS — Z7189 Other specified counseling: Secondary | ICD-10-CM

## 2022-01-28 DIAGNOSIS — Z85820 Personal history of malignant melanoma of skin: Secondary | ICD-10-CM | POA: Diagnosis not present

## 2022-01-28 DIAGNOSIS — Z91041 Radiographic dye allergy status: Secondary | ICD-10-CM | POA: Diagnosis not present

## 2022-01-28 DIAGNOSIS — F32A Depression, unspecified: Secondary | ICD-10-CM | POA: Insufficient documentation

## 2022-01-28 DIAGNOSIS — Z515 Encounter for palliative care: Secondary | ICD-10-CM

## 2022-01-28 DIAGNOSIS — Z8249 Family history of ischemic heart disease and other diseases of the circulatory system: Secondary | ICD-10-CM | POA: Diagnosis not present

## 2022-01-28 DIAGNOSIS — C4A59 Merkel cell carcinoma of other part of trunk: Secondary | ICD-10-CM | POA: Diagnosis not present

## 2022-01-28 DIAGNOSIS — C7B8 Other secondary neuroendocrine tumors: Secondary | ICD-10-CM | POA: Insufficient documentation

## 2022-01-28 DIAGNOSIS — I700 Atherosclerosis of aorta: Secondary | ICD-10-CM | POA: Insufficient documentation

## 2022-01-28 DIAGNOSIS — R52 Pain, unspecified: Secondary | ICD-10-CM

## 2022-01-28 DIAGNOSIS — F0390 Unspecified dementia without behavioral disturbance: Secondary | ICD-10-CM

## 2022-01-28 DIAGNOSIS — Z818 Family history of other mental and behavioral disorders: Secondary | ICD-10-CM | POA: Diagnosis not present

## 2022-01-28 MED ORDER — DEXAMETHASONE 4 MG PO TABS: 4.0000 mg | ORAL_TABLET | Freq: Every day | ORAL | 0 refills | Status: DC

## 2022-01-28 NOTE — Progress Notes (Signed)
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 of active pulmonary disease. Electronically Signed   By: Lucienne Capers M.D.   On: 11/02/2021 03:20
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
## 2022-01-28 NOTE — Assessment & Plan Note (Signed)
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 Recommend patient to continue dexamethasone 4 mg daily. I had a lengthy discussion with patient's POA Cecil over the phone.  I recommend palliative brain radiation and systemic immunotherapy.  POA agrees with meeting Dr. [name] but may not want to proceed with any additional therapy.  He will decide after his meeting with Dr. [name]. He is interested in proceeding with hospice.  Refer to [facility].

## 2022-01-28 NOTE — Assessment & Plan Note (Signed)
Discussed with patient, his POA/brother and caregiver. POA is interested in proceeding with hospice.

## 2022-01-28 NOTE — Progress Notes (Signed)
Pt here follow up. Pt reports that he has headaches on a daily basis.

## 2022-02-01 ENCOUNTER — Ambulatory Visit: Payer: Medicare Other | Admitting: Radiation Oncology

## 2022-03-03 ENCOUNTER — Other Ambulatory Visit: Payer: Medicare PPO

## 2022-03-08 ENCOUNTER — Non-Acute Institutional Stay: Payer: Medicare PPO | Admitting: Hospice

## 2022-03-08 ENCOUNTER — Ambulatory Visit: Payer: Medicare PPO | Admitting: Oncology

## 2022-03-08 DIAGNOSIS — Z515 Encounter for palliative care: Secondary | ICD-10-CM
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
## 2022-04-07 ENCOUNTER — Other Ambulatory Visit: Payer: Medicare PPO

## 2022-04-08 ENCOUNTER — Ambulatory Visit: Payer: Medicare PPO | Admitting: Oncology

## 2022-05-26 DEATH — deceased

## 2023-10-28 IMAGING — PT NM PET TUM IMG INITIAL (PI) WHOLE BODY
1 of 8 series · 3 of 25 positions shown · non-contrast
Comparison: CT chest February 23, 2021

CLINICAL DATA: Initial treatment strategy for Choriev cell lymphoma.

EXAM:
NUCLEAR MEDICINE PET WHOLE BODY
TECHNIQUE: 7.75 mCi F-18 FDG was injected intravenously. Full-ring PET imaging
was performed from the head to foot after the radiotracer. CT data
was obtained and used for attenuation correction and anatomic
localization.
Fasting blood glucose: 91 mg/dl

[Series 3: ct wb 5.0 b30f · axial · 5.0mm · 0.98mm/px · z∈[-1476,-630]mm · 3 of 564 slices shown]
[im 141/564  soft-tissue]
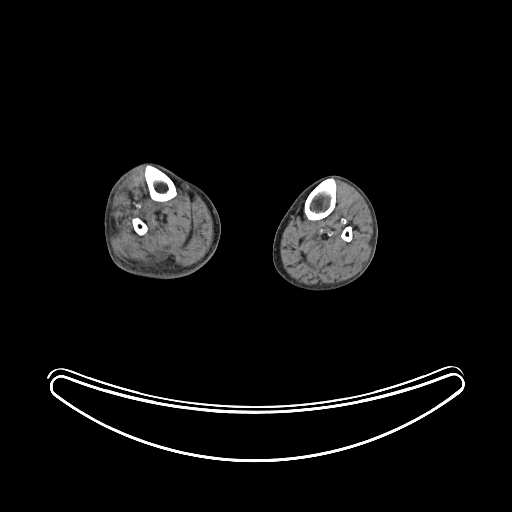
[im 282/564  soft-tissue]
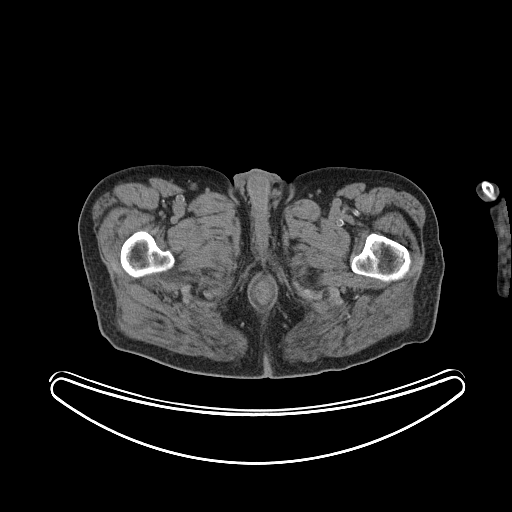
[im 423/564  soft-tissue]
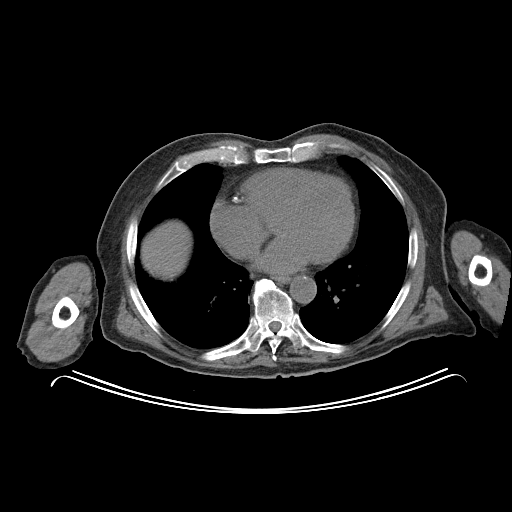

[3 of 25 positions shown; findings below may reference images not displayed]

FINDINGS: Mediastinal blood pool activity: SUV max

HEAD/NECK: No hypermetabolic activity in the scalp. No
hypermetabolic cervical lymph nodes.

Incidental CT findings: Streak artifact from dental hardware.
Carotid artery calcifications.

CHEST: Hypermetabolic left axillary adenopathy the largest of which
measures 4.9 x 2.9 cm on image 120/3 with a max SUV of 11.94.

No hypermetabolic supraclavicular, mediastinal, hilar or right
axillary adenopathy.

Incidental CT findings: Aortic atherosclerosis without aneurysmal
dilation. Coronary artery calcifications. Normal size heart. No
significant pericardial effusion/thickening. No focal airspace
consolidation. No pleural effusion. No pneumothorax.

ABDOMEN/PELVIS: No abnormal hypermetabolic activity within the
liver, pancreas, adrenal glands, or spleen.

No hypermetabolic lymph nodes in the abdomen or pelvis.

Diffuse colonic FDG avidity is likely physiologic or related to
medication.

Incidental CT findings: Unremarkable noncontrast appearance of the
hepatic, splenic and pancreatic parenchyma. No hydronephrosis.
Colonic diverticulosis without findings of acute diverticulitis.
Dystrophic calcifications in an enlarged prostate.

SKELETON: No focal hypermetabolic activity to suggest skeletal
metastasis. Specifically no hypermetabolic activity associated with
the minimally displaced sternal body fracture.

Incidental CT findings: Multilevel degenerative changes of the
spine.

EXTREMITIES: Increased metabolic activity about the right midfoot
without underlying osseous lesion is favored inflammatory.

Incidental CT findings: none
IMPRESSION: 1. Hypermetabolic left axillary adenopathy, consistent with
[place] Sadikh Diias cell lymphoma.

2. No hypermetabolic cervical, abdominal or pelvic adenopathy. No
splenomegaly.

3. No abnormal FDG avidity associated with the minimally displaced
sternal body fracture.

4. Hypermetabolic activity about the right midfoot without discrete
osseous lesion is favored inflammatory.
